# Patient Record
Sex: Female | Born: 1970 | Race: White | Hispanic: Yes | Marital: Married | Smoking: Never smoker
Health system: Southern US, Community
[De-identification: ages and names within clinical notes are randomized; demographics above are authoritative.]

## PROBLEM LIST (undated history)

## (undated) DIAGNOSIS — E559 Vitamin D deficiency, unspecified: Secondary | ICD-10-CM

## (undated) DIAGNOSIS — E785 Hyperlipidemia, unspecified: Secondary | ICD-10-CM

## (undated) DIAGNOSIS — O24419 Gestational diabetes mellitus in pregnancy, unspecified control: Secondary | ICD-10-CM

## (undated) DIAGNOSIS — E049 Nontoxic goiter, unspecified: Secondary | ICD-10-CM

## (undated) HISTORY — DX: Nontoxic goiter, unspecified: E04.9

## (undated) HISTORY — DX: Vitamin D deficiency, unspecified: E55.9

## (undated) HISTORY — DX: Hyperlipidemia, unspecified: E78.5

## (undated) HISTORY — DX: Gestational diabetes mellitus in pregnancy, unspecified control: O24.419

---

## 2001-09-30 ENCOUNTER — Other Ambulatory Visit: Admission: RE | Admit: 2001-09-30 | Discharge: 2001-09-30 | Payer: Self-pay | Admitting: Gynecology

## 2002-11-27 ENCOUNTER — Other Ambulatory Visit: Admission: RE | Admit: 2002-11-27 | Discharge: 2002-11-27 | Payer: Self-pay | Admitting: Gynecology

## 2003-12-15 ENCOUNTER — Other Ambulatory Visit: Admission: RE | Admit: 2003-12-15 | Discharge: 2003-12-15 | Payer: Self-pay | Admitting: Gynecology

## 2004-06-20 ENCOUNTER — Encounter: Admission: RE | Admit: 2004-06-20 | Discharge: 2004-06-20 | Payer: Self-pay | Admitting: Gynecology

## 2004-09-11 ENCOUNTER — Inpatient Hospital Stay (HOSPITAL_COMMUNITY): Admission: AD | Admit: 2004-09-11 | Discharge: 2004-09-14 | Payer: Self-pay | Admitting: Gynecology

## 2004-10-25 ENCOUNTER — Other Ambulatory Visit: Admission: RE | Admit: 2004-10-25 | Discharge: 2004-10-25 | Payer: Self-pay | Admitting: Gynecology

## 2005-11-05 ENCOUNTER — Other Ambulatory Visit: Admission: RE | Admit: 2005-11-05 | Discharge: 2005-11-05 | Payer: Self-pay | Admitting: Gynecology

## 2006-12-03 ENCOUNTER — Other Ambulatory Visit: Admission: RE | Admit: 2006-12-03 | Discharge: 2006-12-03 | Payer: Self-pay | Admitting: Gynecology

## 2007-12-15 ENCOUNTER — Other Ambulatory Visit: Admission: RE | Admit: 2007-12-15 | Discharge: 2007-12-15 | Payer: Self-pay | Admitting: Gynecology

## 2008-02-04 ENCOUNTER — Other Ambulatory Visit: Admission: RE | Admit: 2008-02-04 | Discharge: 2008-02-04 | Payer: Self-pay | Admitting: Diagnostic Radiology

## 2008-02-04 ENCOUNTER — Encounter: Admission: RE | Admit: 2008-02-04 | Discharge: 2008-02-04 | Payer: Self-pay | Admitting: Surgery

## 2008-02-04 ENCOUNTER — Encounter (INDEPENDENT_AMBULATORY_CARE_PROVIDER_SITE_OTHER): Payer: Self-pay | Admitting: Diagnostic Radiology

## 2008-12-20 ENCOUNTER — Other Ambulatory Visit: Admission: RE | Admit: 2008-12-20 | Discharge: 2008-12-20 | Payer: Self-pay | Admitting: Gynecology

## 2008-12-20 ENCOUNTER — Encounter: Payer: Self-pay | Admitting: Gynecology

## 2008-12-20 ENCOUNTER — Ambulatory Visit: Payer: Self-pay | Admitting: Gynecology

## 2008-12-21 ENCOUNTER — Ambulatory Visit: Payer: Self-pay | Admitting: Gynecology

## 2010-02-13 ENCOUNTER — Other Ambulatory Visit: Admission: RE | Admit: 2010-02-13 | Discharge: 2010-02-13 | Payer: Self-pay | Admitting: Gynecology

## 2010-02-13 ENCOUNTER — Ambulatory Visit: Payer: Self-pay | Admitting: Gynecology

## 2010-02-14 ENCOUNTER — Ambulatory Visit: Payer: Self-pay | Admitting: Gynecology

## 2010-02-20 ENCOUNTER — Ambulatory Visit: Payer: Self-pay | Admitting: Gynecology

## 2010-02-27 ENCOUNTER — Ambulatory Visit: Payer: Self-pay | Admitting: Gynecology

## 2010-06-26 ENCOUNTER — Ambulatory Visit: Payer: Self-pay | Admitting: Gynecology

## 2010-12-22 NOTE — H&P (Signed)
Tina Huffman, Tina Huffman               ACCOUNT NO.:  [id number]   MEDICAL RECORD NO.:  [id number]          PATIENT TYPE:  MAT   LOCATION:  [facility]                          FACILITY:  [facility]   PHYSICIAN:  Juan H. Lily Peer, M.D.DATE OF BIRTH:  1971/03/19   DATE OF ADMISSION:  09/11/2004  DATE OF DISCHARGE:                                HISTORY & PHYSICAL   The patient will be admitted to [facility] this evening.  Please have  history and physical available.   CHIEF COMPLAINT:  1.  Post dates.  2.  Gestational diabetic.   HISTORY:  The patient is a 40 year old gravidaida 1, para 0, whose estimated  date of confinement is February 2.  The patient currently 40-1/2 weeks'  estimated gestational age.  Has been followed with regular visits since she  was diagnosed with gestational diabetes and was controlled with diet.  She  has had nonstress tests and antepartum testing consisting of ultrasounds and  AFIs.  Her last ultrasound was done today in the office and demonstrated a  viable fetus at 7 pounds 15 ounces with an AFI of 10.7, vertex, heart rate  148 beats per minute, and grade 3 placenta.  Group B strep was negative.  The patient had declined genetic amniocentesis as well as cystic fibrosis  screen, but her AFP was normal, and GBS culture was negative.  The patient  is a Scientist, product/process development.  During her pregnancy she had been referred during  the third trimester to Dr. Sherrie George at [facility] due to an abnormal fetal  heart rate, but she did a detailed level III scan and no abnormalities were  noted.   PAST MEDICAL HISTORY:  The patient denies any allergies.  She had been  treated for a urinary tract infection during her pregnancy.  She has  otherwise had gestational diabetes but no other medical problems reported.   REVIEW OF SYSTEMS:  See [place] form.   PHYSICAL EXAMINATION:  VITAL SIGNS:  Her blood pressure today was 102/74,  and urine was negative for protein and sugar.  Weight  139 pounds.  HEENT:  Unremarkable.  NECK:  Supple, trachea midline.  No carotid bruits, no thyromegaly.  CHEST:  Lungs clear to auscultation without rhonchi or wheezes.  CARDIAC:  Regular rate and rhythm, no murmurs, gallops.  BREASTS:  Exam not done.  ABDOMEN:  Gravid uterus, vertex presentation by [employer].  PELVIC:  Cervix 1 cm and long, posterior.  EXTREMITIES:  DTR 1+, negative clonus.   PRENATAL LABORATORY DATA:  O positive blood type, negative antibody screen.  VDRL was nonreactive.  Rubella immune.  Hepatitis B surface antigen and HIV  were negative.  Alpha-fetoprotein was normal.  Diabetes screen abnormal with  abnormal three-hour GTT.  GBS culture was negative.   ASSESSMENT:  69.  A 40 year old gravida 1, para, at 40-1/2 weeks' estimated gestational      age.  2.  Gestational diabetic, diet-controlled.  3.  Ultrasound today with an estimated fetal weight of 7 pounds 15 ounces,      female, in the vertex presentation, normal amniotic fluid  index, group B      strep culture was negative, and reactive fetal heart rate tracing today.   The patient will be admitted this evening for Cervidil for cervical  ripening, followed by initiation of Pitocin the following morning.  Risks,  benefits and pros and cons were discussed with the patient, all questions  answered.  Will follow accordingly.   PLAN:  As per assessment above.      JHF/MEDQ  D:  09/11/2004  T:  09/11/2004  Job:  [id number]

## 2011-02-09 ENCOUNTER — Encounter: Payer: Self-pay | Admitting: *Deleted

## 2011-02-26 ENCOUNTER — Encounter: Payer: Self-pay | Admitting: Gynecology

## 2011-02-26 ENCOUNTER — Other Ambulatory Visit (HOSPITAL_COMMUNITY)
Admission: RE | Admit: 2011-02-26 | Discharge: 2011-02-26 | Disposition: A | Discharge status: Home / Self Care | Payer: Self-pay | Source: Ambulatory Visit | Attending: Gynecology | Admitting: Gynecology

## 2011-02-26 ENCOUNTER — Ambulatory Visit (INDEPENDENT_AMBULATORY_CARE_PROVIDER_SITE_OTHER): Payer: Self-pay | Admitting: Gynecology

## 2011-02-26 VITALS — BP 120/82 | Ht 59.75 in | Wt 129.0 lb

## 2011-02-26 DIAGNOSIS — Z833 Family history of diabetes mellitus: Secondary | ICD-10-CM

## 2011-02-26 DIAGNOSIS — Z13220 Encounter for screening for lipoid disorders: Secondary | ICD-10-CM

## 2011-02-26 DIAGNOSIS — Z01419 Encounter for gynecological examination (general) (routine) without abnormal findings: Secondary | ICD-10-CM | POA: Insufficient documentation

## 2011-02-26 DIAGNOSIS — E039 Hypothyroidism, unspecified: Secondary | ICD-10-CM

## 2011-02-26 DIAGNOSIS — Z124 Encounter for screening for malignant neoplasm of cervix: Secondary | ICD-10-CM

## 2011-02-26 DIAGNOSIS — Z0000 Encounter for general adult medical examination without abnormal findings: Secondary | ICD-10-CM

## 2011-02-26 DIAGNOSIS — R82998 Other abnormal findings in urine: Secondary | ICD-10-CM

## 2011-02-26 DIAGNOSIS — R635 Abnormal weight gain: Secondary | ICD-10-CM

## 2011-02-26 MED ORDER — LEVOTHYROXINE SODIUM 50 MCG PO TABS: 50.0000 ug | ORAL_TABLET | Freq: Every day | ORAL | Status: DC

## 2011-02-26 NOTE — Progress Notes (Signed)
Subjective:     Patient ID: Tina Huffman, female   DOB: June 28, 1971, 40 y.o.   MRN: [id number]  HPI8 year old female patient presented to the office for her annual gynecological examination. Patient with known left lower thyroid nodule. Patient been followed by Dr. Gerrit Friends. She is seeing him this year. Patient is currently on Synthroid 50 mcg daily. Patient is otherwise asymptomatic. She is having regular menstrual cycles and is using condoms for contraception.   Review of Systems  Constitutional: Negative.   HENT: Negative.   Eyes: Negative.   Respiratory: Negative.   Cardiovascular: Negative.   Gastrointestinal: Negative.   Genitourinary: Negative.   Musculoskeletal: Negative.   Skin: Negative.   Hematological: Negative.   All other systems reviewed and are negative.       Objective:   Physical Exam  Constitutional: She appears well-developed and well-nourished.  HENT:  Head: Normocephalic and atraumatic.  Eyes: Conjunctivae and EOM are normal. Pupils are equal, round, and reactive to light.  Neck: Normal range of motion. Neck supple.    Cardiovascular: Normal rate, regular rhythm and normal heart sounds.   Pulmonary/Chest: Effort normal and breath sounds normal.  Abdominal: Soft. Bowel sounds are normal.  Genitourinary: Vagina normal and uterus normal.  Musculoskeletal: Normal range of motion.  Neurological: She is alert.  Skin: Skin is warm.  Psychiatric: She has a normal mood and affect.       Assessment:    normal annual gynecological examination. Patient with persistent small nodule left lower lobe of thyroid gland. Patient had ultrasound scanning of the thyroid gland early this year by Dr. Gerrit Friends. We will check her thyroid function test today.     Plan:   patient to schedule her annual mammogram within the next few weeks. She will stop a lap to get a CBC thyroid function tests screening cholesterol random blood sugar urinalysis. Her Pap smear was done today.  We'll see her back in one year or when necessary.

## 2011-12-03 ENCOUNTER — Telehealth (INDEPENDENT_AMBULATORY_CARE_PROVIDER_SITE_OTHER): Payer: Self-pay | Admitting: Surgery

## 2011-12-10 ENCOUNTER — Other Ambulatory Visit (INDEPENDENT_AMBULATORY_CARE_PROVIDER_SITE_OTHER): Payer: Self-pay

## 2011-12-10 DIAGNOSIS — E041 Nontoxic single thyroid nodule: Secondary | ICD-10-CM

## 2011-12-13 ENCOUNTER — Ambulatory Visit
Admission: RE | Admit: 2011-12-13 | Discharge: 2011-12-13 | Disposition: A | Discharge status: Home / Self Care | Payer: No Typology Code available for payment source | Source: Ambulatory Visit | Attending: Surgery | Admitting: Surgery

## 2011-12-13 DIAGNOSIS — E041 Nontoxic single thyroid nodule: Secondary | ICD-10-CM

## 2012-01-03 ENCOUNTER — Ambulatory Visit (INDEPENDENT_AMBULATORY_CARE_PROVIDER_SITE_OTHER): Payer: No Typology Code available for payment source | Admitting: Surgery

## 2012-01-03 ENCOUNTER — Encounter (INDEPENDENT_AMBULATORY_CARE_PROVIDER_SITE_OTHER): Payer: Self-pay | Admitting: Surgery

## 2012-01-03 VITALS — BP 88/62 | HR 76 | Temp 97.7°F | Resp 14 | Ht 60.0 in | Wt 128.6 lb

## 2012-01-03 DIAGNOSIS — E039 Hypothyroidism, unspecified: Secondary | ICD-10-CM

## 2012-01-03 DIAGNOSIS — E041 Nontoxic single thyroid nodule: Secondary | ICD-10-CM

## 2012-01-03 MED ORDER — LEVOTHYROXINE SODIUM 50 MCG PO TABS: 50.0000 ug | ORAL_TABLET | Freq: Every day | ORAL | Status: DC

## 2012-01-03 NOTE — Progress Notes (Signed)
Visit Diagnoses: 1. Hypothyroidism   2. Thyroid nodule, uninodular, left lobe     HISTORY: The patient is a 41 year old female followed for a dominant left thyroid nodule. This lesion has waxed and waned in size. She is on thyroid hormone suppression with Synthroid 50 mcg daily.  Patient denies any compressive symptoms. She has not noted any change in self-examination. She denies any discomfort.  PERTINENT REVIEW OF SYSTEMS: Denies tremor. Denies palpitations. Denies dysphagia. Denies vocal changes.  EXAM: HEENT: normocephalic; pupils equal and reactive; sclerae clear; dentition good; mucous membranes moist NECK:  Palpable dominant nodule left thyroid lobe, relatively firm, mobile swallowing, approximately 3 cm in size; asymmetric on extension; no palpable anterior or posterior cervical lymphadenopathy; no supraclavicular masses; no tenderness CHEST: clear to auscultation bilaterally without rales, rhonchi, or wheezes CARDIAC: regular rate and rhythm without significant murmur; peripheral pulses are full EXT:  non-tender without edema; no deformity NEURO: no gross focal deficits; no sign of tremor   IMPRESSION: #1 Dominant left thyroid nodule, 3.4 cm, clinically stable, with history of benign cytopathology #2 hypothyroidism  PLAN: I discussed with the patient and her husband the options for management. At this point there is no absolute indication for resection. Patient remains asymptomatic. Fine-needle aspiration cytology is benign. Patient is tolerating her thyroid hormone suppression well.  I last her gynecologist to obtain a TSH level at the time of her other laboratory work scheduled for July of this year.  I have renewed her prescription for Synthroid 50 mcg daily for one year.  We will repeat her thyroid ultrasound examination in one year. Patient will return at that time for physical exam and to review the above results.  Velora Heckler, MD, FACS General & Endocrine  Surgery [facility]

## 2012-02-27 ENCOUNTER — Encounter: Payer: Self-pay | Admitting: Gynecology

## 2012-02-27 ENCOUNTER — Ambulatory Visit (INDEPENDENT_AMBULATORY_CARE_PROVIDER_SITE_OTHER): Payer: No Typology Code available for payment source | Admitting: Gynecology

## 2012-02-27 VITALS — BP 110/74 | Ht 60.0 in | Wt 130.0 lb

## 2012-02-27 DIAGNOSIS — E041 Nontoxic single thyroid nodule: Secondary | ICD-10-CM

## 2012-02-27 DIAGNOSIS — Z01419 Encounter for gynecological examination (general) (routine) without abnormal findings: Secondary | ICD-10-CM

## 2012-02-27 DIAGNOSIS — Z8632 Personal history of gestational diabetes: Secondary | ICD-10-CM | POA: Insufficient documentation

## 2012-02-27 DIAGNOSIS — E039 Hypothyroidism, unspecified: Secondary | ICD-10-CM

## 2012-02-27 LAB — LIPID PANEL
Cholesterol: 208 mg/dL — ABNORMAL HIGH (ref 0–200)
HDL: 40 mg/dL (ref >39)
LDL Cholesterol: 130 mg/dL — ABNORMAL HIGH (ref 0–99)
Total CHOL/HDL Ratio: 5.2 Ratio
Triglycerides: 188 mg/dL — ABNORMAL HIGH (ref <150)
VLDL: 38 mg/dL (ref 0–40)

## 2012-02-27 LAB — CBC WITH DIFFERENTIAL/PLATELET
Eosinophils Absolute: 0.1 10*3/uL (ref 0.0–0.7)
Eosinophils Relative: 3 % (ref 0–5)
Hemoglobin: 14.2 g/dL (ref 12.0–15.0)
Lymphocytes Relative: 27 % (ref 12–46)
Lymphs Abs: 1.6 10*3/uL (ref 0.7–4.0)
MCH: 30.3 pg (ref 26.0–34.0)
MCV: 89.1 fL (ref 78.0–100.0)
Monocytes Relative: 6 % (ref 3–12)
Neutrophils Relative %: 63 % (ref 43–77)
Platelets: 250 10*3/uL (ref 150–400)
RBC: 4.68 MIL/uL (ref 3.87–5.11)
WBC: 5.7 10*3/uL (ref 4.0–10.5)

## 2012-02-27 NOTE — Progress Notes (Signed)
Tina Huffman August 08, 1970 [id number]   History:    41 y.o.  for annual gyn exam with no complaints today. Patient with known history of a goiter (left thyroid nodule) who is being followed by Dr. Darnell Level. The thyroid nodule waxes and wanes in size but by in large has remained stable. The findings of her most recent ultrasound as follows:  Findings:  Right thyroid lobe: 11 x 11 x 57 mm, homogeneous in echotexture  Left thyroid lobe: 23 x 29 x 48 mm  Isthmus: 2.1 mm in thickness  Focal nodules: 21 x 25 x 34 mm complex mostly solid, inferior left  Lymphadenopathy: None visualized.  IMPRESSION:  1. 3.4 cm solitary left thyroid nodule. Correlate with  previous biopsy results.    Patient had been offered several options please see note from May 30 from Dr. Gerrit Friends. Patient is currently using condoms for contraception. Her cycles are reported to be regular. Her last mammogram was in 2010 which was normal. She does her monthly self breast examination. She has history of the past of hyperlipidemia and was on Zocor until last year currently on no medication and is working on diet and exercise. Her last Pap smear was normal in 2012. on Synthroid 50 mcg daily and is otherwise asymptomatic. Patient has had history of gestational diabetes in the past.   Past medical history,surgical history, family history and social history were all reviewed and documented in the EPIC chart.  Gynecologic History Patient's last menstrual period was 02/13/2012. Contraception: condoms Last Pap: 2012. Results were: normal Last mammogram: 2010. Results were: normal  Obstetric History OB History    Grav Para Term Preterm Abortions TAB SAB Ect Mult Living   1 1 1       1      # Outc Date GA Lbr Len/2nd Wgt Sex Del Anes PTL Lv   1 TRM     F SVD  No Yes       ROS: A ROS was performed and pertinent positives and negatives are included in the history.  GENERAL: No fevers or chills. HEENT: No change in vision, no  earache, sore throat or sinus congestion. NECK: No pain or stiffness. CARDIOVASCULAR: No chest pain or pressure. No palpitations. PULMONARY: No shortness of breath, cough or wheeze. GASTROINTESTINAL: No abdominal pain, nausea, vomiting or diarrhea, melena or bright red blood per rectum. GENITOURINARY: No urinary frequency, urgency, hesitancy or dysuria. MUSCULOSKELETAL: No joint or muscle pain, no back pain, no recent trauma. DERMATOLOGIC: No rash, no itching, no lesions. ENDOCRINE: No polyuria, polydipsia, no heat or cold intolerance. No recent change in weight. HEMATOLOGICAL: No anemia or easy bruising or bleeding. NEUROLOGIC: No headache, seizures, numbness, tingling or weakness. PSYCHIATRIC: No depression, no loss of interest in normal activity or change in sleep pattern.     Exam: chaperone present  BP 110/74  Ht 5' (1.524 m)  Wt 130 lb (58.968 kg)  BMI 25.39 kg/m2  LMP 02/13/2012  Body mass index is 25.39 kg/(m^2).  General appearance : Well developed well nourished female. No acute distress HEENT: Neck supple, trachea midline, no carotid bruits, left lower pole thyroid nodule to a half centimeters and mobile and firm  Lungs: Clear to auscultation, no rhonchi or wheezes, or rib retractions  Heart: Regular rate and rhythm, no murmurs or gallops Breast:Examined in sitting and supine position were symmetrical in appearance, no palpable masses or tenderness,  no skin retraction, no nipple inversion, no nipple discharge, no skin discoloration, no axillary  or supraclavicular lymphadenopathy Abdomen: no palpable masses or tenderness, no rebound or guarding Extremities: no edema or skin discoloration or tenderness  Pelvic:  Bartholin, Urethra, Skene Glands: Within normal limits             Vagina: No gross lesions or discharge  Cervix: No gross lesions or discharge  Uterus  anteverted, normal size, shape and consistency, non-tender and mobile  Adnexa  Without masses or tenderness  Anus and  perineum  normal   Rectovaginal  normal sphincter tone without palpated masses or tenderness             Hemoccult not done     Assessment/Plan:  41 y.o. female for annual exam with history of stable left thyroid nodule. Patient been suppressed with Synthroid 50 mcg daily. Patient is being followed by the general surgeon Dr. Gerrit Friends. Thyroid panel will be work today along with a fasting lipid profile and fasting blood sugar and CBC and urinalysis. She will not need a Pap smear today. New Pap smear screening guidelines discussed. Requisition was provided her to schedule her mammogram. She was encouraged to continue to do her monthly self breast examination.    Ok Edwards MD, 9:36 AM 02/27/2012    All

## 2012-02-27 NOTE — Patient Instructions (Addendum)
Enfermedades tiroideas  (Thyroid Diseases)  La tiroides es una glándula con forma de mariposa que se encuentra en el cuello. Se ubica justo por arriba de las clavículas. Es una de las glándulas endocrinas, que producen hormonas. La tiroides interviene en el control del metabolismo. El metabolismo es el modo en que el organismo utiliza los alimentos.   Millones de personas sufren enfermedades tiroideas. Las mujeres experimentan problemas en las tiroides con más frecuencia que los hombres. De hecho, los problemas de una tiroides muy activa (hipertiroidismo) ocurre en el 1% de toda las mujeres. Si usted sufre una enfermedad en la tiroides, su organismo puede estar utilizando la energía más lentamente, o más rápidamente de lo que debería.   Los problemas en las tiroides también incluyen una enfermedad inmunológica en la que el organismo reacciona en contra de la glándula tiroides (enfermedad denominada tiroiditis). Un problema diferente implica la aparición de bultos (denominados nódulos) que se desarrollan en la glándula. Los nódulos, generalmente pero no siempre, son benignos.  PROBLEMAS DE TIROIDES MÁS FRECUENTES Y SUS CAUSAS  Hay muchas causas que originan problemas en las tiroides. El tratamiento depende del diagnóstico exacto e incluye la recomposición del metabolismo a valores normales.  Hipertiroidismo  Se denomina hipertiroidismo al exceso de hormona tiroidea debido a una glándula tiroides muy activa. En el hipertiroidismo, el metabolismo se acelera. Una de las formas más frecuentes de hipertiroidismo se denomina enfermedad de [place]. Esta enfermedad tiende a aparecer en algunas familias. Aunque se considera que la causa se encuentra en un problema del sistema inmunológico, la naturaleza exacta del problema genético es desconocida.  Hipotiroidismo  Se denomina hipotiroidismo a la falta de hormona tiroidea debido a una glándula tiroidea poco activa. En el hipotiroidismo, el metabolismo se ralentiza. Son varias  las causas para esta enfermedad. La mayoría de las causas afectan la glándula tiroides directamente y dañan su capacidad de fabricar la cantidad suficiente de hormonas.   En algunos casos raros de tratarse de un tumor en la glándula hipófisis (ubicada cerca de la base del cerebro). El tumor puede bloquear la hipófisis y hacer que deje de producir la hormona que estimula la tiroides (TSH), El organismo produce TSH para estimular buen funcionamiento de la tiroides. Si la hipófisis no produce la cantidad suficiente de TSH, la tiroides no puede producir la cantidad suficiente de hormonas necesarias para mantener un buen estado de salud.  Ya sea que el problema se encuentre en una enfermedad de la tiroides o de la hipófisis, el resultado es que la tiroides no produce la cantidad suficiente de hormonas. El hipotiroidismo ocasiona el mal funcionamiento de muchos procesos físicos y mentales. El organismo consume menos oxígeno y produce menos calor corporal.  Nódulos en las tiroides  Un módulo en la tiroides es un pequeño bulto que se encuentra sobre la glándula. Estos nódulos son frecuentes. Puede ser un crecimiento del tejido tiroideo o un quiste lleno de líquido. Ambos forman un bulto sobre la glándula. Casi la mitad de todas las personas tendrá pequeños nódulos en las tiroides en algún momento de su vida. Típicamente no pueden notarse hasta que son grandes y afectan el tamaño normal de la tiroides. Los nódulos más grandes (alrededor de 1 cm) ocurren en alrededor del 5% de las personas.  Aunque la mayoría de estos nódulos no son malignos, deberán realizarse los estudios para descartar el cáncer. También algunos nódulos pueden producir mucha cantidad de hormona tiroidea o agrandarse demasiado. Los nódulos grandes o una glándula agrandada pueden interferir en la   Otros problemas son el cncer y la tiroiditis La tiroiditis es un mal  funcionamiento del sistema inmunolgico del organismo. Normalmente, el sistema inmunolgico funciona defendiendo al organismo de las infecciones y [employer]. Cuando el sistema inmunolgico no funciona adecuadamente, puede atacar clulas, tejidos y rganos normales. Ejemplos de enfermedades autoinmunes son la tiroiditis de Hashimoto (que causa una funcin tiroidea lenta) y la enfermedad de [place] (que causa exceso del funcionamiento tiroideo). SNTOMAS Los sntomas varan segn el tipo exacto del problema de la tiroides. Hipertiroidismo- cuando la glndula tiroides es [place] y produce ms hormona tiroidea que la que el organismo necesita. La causa ms frecuente es la enfermedad de [place]. Demasiada cantidad de hormona tiroidea puede causar alguno o todos los siguientes sntomas.  Ansiedad.   Irritabilidad.   Dificultad para dormir.   Fatiga.   Latidos cardacos irregulares o rpidos.   Temblor fino en manos o dedos.   Aumento de la transpiracin.   Sensibilidad al calor.   Prdida de peso a pesar de la ingesta normal de alimentos.   Debilitamiento del cabello.   Agrandamiento de las tiroides (bocio).   Perodos menstruales escasos.   Movimientos intestinales frecuentes.  La enfermedad de [place] puede especificamente causar problemas en la piel y los ojos. Los problemas en la piel implican el enrojecimiento e hinchazn de la pierna y de la parte superior de los pies. Los problemas oculares pueden consistir en:  Manson [place] de lagrimeo y sensacin de tener un cuerpo extrao en uno o ambos ojos.   Enrojecimiento o inflamacin.   Mayor [employer] prpados.   Hinchazn de los prpados y los tejidos que rodean los ojos.   Sensibilidad a la luz.   lceras en la crnea.   Visin doble.   Movimientos limitados de los ojos.   Visin borrosa o reducida.  Hipotiroidismo: la glndula tiroides no es lo Science writer. Es un trastorno ms frecuente que el  hipertiroidismo. Los sntomas pueden variar mucho segn la gravedad de la deficiencia hormonal. Los sntomas pueden desarrollarse durante un largo perodo incluyen algunos de los siguientes:  [place].   Lentitud   Mayor sensibilidad al fro.   Constipacin.   Piel plida y [place].   Rostro hinchado.   Voz ronca.   Nivel elevado de colesterol en la sangre.   Aumento de peso sin motivo.   Dolor , sensibilidad y Forensic psychologist.   Dolor, rigidez o Architect.   Debilitamiento muscular.   Perodos menstruales ms abundantes que lo normal.   Cabello y uas debilitados.   Depresin.  Ndulos en las tiroides- generalmente no causan signos o sntomas, En ocasiones pueden agrandarse de modo que puede sentirlos o verlos como una zona hinchada en la base del cuello. Puede notar el bulto al [employer]. Los hombres pueden notarlo cuando el cuello de la camisa comienza a sentirse ajustado. Algunos ndulos producen demasiada hormona tiroidea. Esto puede [employer] mismos sntomas que el hipertiroidismo (vase ms [place]).  Los ndulos en las tiroides rara vez son malignos. Sin embargo, es probable que sea maligno si:  Crece rpidamente o se lo siente duro.   Le ocasiona ronquera o tiene problemas para tragar o respirar.   Se agrandan los ganglios debajo de la mandbula o en el cuello.  DIAGNSTICO [employer] tantas enfermedades de las tiroides, Set designer algunas pruebas. Deber establecer un diagnstico exacto. Estas pruebas pueden ser:  Anlisis de sangre y anticuerpos.   Escaneos especiales de la  tiroides, usando pequeas cantidades inocuas de yodo radiactivo.   Ecografas de la glndula, (especialmente si hay un ndulo o bulto).   Una biopsia. Esto se lleva a cabo con una aguja especial. Una biopsia de aguja es un procedimiento por el que se obtiene [place] de clulas de la glndula. El tejido ser [employer] en un laboratorio y  examinado al microscopio.  TRATAMIENTO El tratamiento depende del diagnstico exacto. Hipertiroidismo  Los betabloqueantes ayudan a [employer].   Los medicamentos antitiroideos evitan la produccin excesiva de hormona.   El tratamiento con yodo radiactivo puede destruir las clulas tiroideas hiperactivas. Reduce permanentemente la cantidad de hormona que se produce.   Tratamiento quirrgico para extirpar la glndula.   Tratamientos para los problemas oculares como en la enfermedad de [place] tambin incluyen medicamentos y cirugas oculares especiales, si se considera apropiado.  Hipotiroidismo Reposicin de hormona tiroidea con levotiroxina es el tratamiento fundamental. La reposicin de hormona tiroidea es un tratamiento de por vida y requerir el control y ajustes peridicos. Ndulos en las tiroides  Hay que observarlos. Si un ndulo pequeo no causa sntomas o no hay signos de cncer en la biopsia, no se realiza tratamiento. Se recomienda realizar exmenes y anlisis de [place] peridicamente.   Los medicamentos antitiroideos o el que se realiza con yodo radiactivo se recomiendan si los ndulos producen demasiada hormona (vase Tratamiento para el hipotiroidismo, ms [place]).   Ablacin alcohlica Las inyecciones de pequeas cantidades de alcohol etlico (etanol) pueden hacer que un ndulo no canceroso disminuya su tamao.   Ciruga (vase Tratamiento para el hipertiroidismo, ms [place])  INSTRUCCIONES PARA EL CUIDADO DOMICILIARIO  Tome todos los medicamentos de la forma en que se le indic.   Hgase los anlisis recomendados.  SOLICITE ATENCIN MDICA SI:  Siente que tiene sntomas de hipertiroidismo o hipotiroidismo como los que se han descrito.   Desarrolla nuevos bultos o ndulos en el cuello o en la zona de la tiroides que no haba notado antes.   Siente efectos adversos por los medicamentos prescriptos.   Tiene problemas para hablar, respirar o tragar.   SOLICITE ATENCIN MDICA DE INMEDIATO SI:  La temperatura se eleva por encima de 102 F (39.8 C).   La sudoracin es intensa.   Tiene palpitaciones o frecuencia cardaca acelerada.   Le falta el aire.   Presenta nuseas o vmitos.   Tiene temblores intensos.   Se siente agitado.   Se marea o tiene un episodio de [place].  Document Released: 11/08/2008 Document Revised: 07/12/2011 [facility] Patient Information 2012 [place], [place].

## 2012-02-28 LAB — URINALYSIS W MICROSCOPIC + REFLEX CULTURE
Casts: NONE SEEN
Crystals: NONE SEEN
Glucose, UA: NEGATIVE mg/dL
Hgb urine dipstick: NEGATIVE
Nitrite: NEGATIVE
Specific Gravity, Urine: 1.009 (ref 1.005–1.030)
pH: 7.5 (ref 5.0–8.0)

## 2012-03-02 LAB — URINE CULTURE: Colony Count: >=100000

## 2012-03-04 ENCOUNTER — Ambulatory Visit (INDEPENDENT_AMBULATORY_CARE_PROVIDER_SITE_OTHER): Payer: No Typology Code available for payment source | Admitting: Gynecology

## 2012-03-04 ENCOUNTER — Encounter: Payer: Self-pay | Admitting: Gynecology

## 2012-03-04 ENCOUNTER — Other Ambulatory Visit: Payer: Self-pay | Admitting: *Deleted

## 2012-03-04 VITALS — BP 110/78

## 2012-03-04 DIAGNOSIS — E785 Hyperlipidemia, unspecified: Secondary | ICD-10-CM | POA: Insufficient documentation

## 2012-03-04 DIAGNOSIS — E079 Disorder of thyroid, unspecified: Secondary | ICD-10-CM

## 2012-03-04 DIAGNOSIS — E7800 Pure hypercholesterolemia, unspecified: Secondary | ICD-10-CM

## 2012-03-04 NOTE — Patient Instructions (Signed)
Control del colesterol  Los niveles de colesterol en el organismo estn determinados significativamente por su dieta. Los niveles de colesterol tambin se relacionan con la enfermedad cardaca. El material que sigue ayuda a explicar esta relacin y a analizar qu puede hacer para mantener su corazn sano. No todo el colesterol es malo. Las lipoprotenas de baja densidad (LDL) forman el colesterol "malo". El colesterol malo puede ocasionar depsitos de grasa que se acumulan en el interior de las arterias. Las lipoprotenas de alta densidad (HDL) es el colesterol "bueno". Ayuda a remover el colesterol LDL "malo" de la sangre. El colesterol es un factor de riesgo muy importante para la enfermedad cardaca. Otros factores de riesgo son la hipertensin arterial, el hbito de fumar, el estrs, la herencia y el peso.   El msculo cardaco obtiene el suministro de sangre a travs de las arterias coronarias. Si su colesterol LDL ("malo") est elevado y el HDL ("bueno") es bajo, tiene un factor de riesgo para que se formen depsitos de grasa en las arterias coronarias (los vasos sanguneos que suministran sangre al corazn). Esto hace que haya menos lugar para que la sangre circule. Sin la suficiente sangre y oxgeno, el msculo cardaco no puede funcionar correctamente, y usted podr sentir dolores en el pecho (angina pectoris). Cuando una arteria coronaria se cierra completamente, una parte del msculo cardaco puede morir (infarto de miocardio).  CONTROL DEL COLESTEROL Cuando el profesional que lo asiste enva la sangre al laboratorio para conocer el nivel de colesterol, puede realizarle tambin un perfil completo de los lpidos. Con esta prueba, se puede determinar la cantidad total de colesterol, as como los niveles de LDL y HDL. Los triglicridos son un tipo de grasa que circula en la sangre y que tambin puede utilizarse  para determinar el riesgo de enfermedad cardaca. En la siguiente tabla se establecen los nmeros ideales: Prueba: Colesterol total  Menos de 200 mg/dl.  Prueba: LDL "colesterol malo"  Menos de 100 mg/dl.   Menos de 70 mg/dl si tiene riesgo muy elevado de sufrir un ataque cardaco o muerte cardaca sbita.  Prueba: HDL "colesterol bueno"  Mujeres: Ms de 50 mg/dl.   Hombres: Ms de 40 mg/dl.  Prueba: Trigliceridos  Menos de 150 mg/dl.    CONTROL DEL COLESTEROL CON DIETA Aunque factores como el ejercicio y el estilo de vida son importantes, la "primera lnea de ataque" es la dieta. Esto se debe a que se sabe que ciertos alimentos hacen subir el colesterol y otros lo bajan. El objetivo debe ser equilibrar los alimentos, de modo que tengan un efecto sobre el colesterol y, an ms importante, reemplazar las grasas saturadas y trans con otros tipos de grasas, como las monoinsaturadas y las poliinsaturadas y cidos grasos omega-3 . En promedio, una persona no debe consumir ms de 15 a 17 g de grasas saturadas por da. Las grasas saturadas y trans se consideran grasas "malas", ya que elevan el colesterol LDL. Las grasas saturadas se encuentran principalmente en productos animales como carne, manteca y crema. Pero esto no significa que usted debe sacrificar todas sus comidas favoritas. Actualmente, como lo muestra el cuadro que figura al final de este documento, hay sustitutos de buen sabor, bajos en grasas y en colesterol, para la mayora de los alimentos que a usted le gusta comer. Elija aquellos alimentos alternativos que sean bajos en grasas o sin grasas. Elija cortes de carne del cuarto trasero o lomo ya que estos cortes son los que tienen menor cantidad de grasa   y colesterol. El pollo (sin piel), el pescado, la carne de ternera, y la pechuga de pavo molida son excelentes opciones. Elimine las carnes grasosas como los hotdogs o el salami. Los mariscos tienen poco o nada de grasas saturadas. Cuando  consuma carne magra, carne de aves de corral, o pescado, hgalo en porciones de 85 gramos (3 onzas). Las grasas trans tambin se llaman "aceites parcialmente hidrogenados". Son aceites manipulados cientficamente de modo que son slidos a temperatura ambiente, tienen una larga vida y mejoran el sabor y la textura de los alimentos a los que se agregan. Las grasas trans se encuentran en la margarina, masitas, crackers y alimentos horneados.  Para hornear y cocinar, el aceite es un excelente sustituto para la mantequilla. Los aceites monoinsaturados tienen un beneficio particular, ya que se cree que disminuyen el colesterol LDL (colesterol malo) y elevan el HDL. Deber evitar los aceites tropicales saturados como el de coco y el de palma.  Recuerde, adems, que puede comer sin restricciones los grupos de alimentos que son naturalmente libres de grasas saturadas y grasas trans, entre los que se incluyen el pescado, las frutas (excepto el aguacate), verduras, frijoles, cereales (cebada, arroz, cuzcuz, trigo) y las pastas (sin salsas con crema)   IDENTIFIQUE LOS ALIMENTOS QUE DISMINUYEN EL COLESTEROL  Pueden disminuir el colesterol las fibras solubles que estn en las frutas, como las manzanas, en los vegetales como el brcoli, las patatas y las zanahorias; en las legumbres como frijoles, guisantes y lentejas; y en los cereales como la cebada. Los alimentos fortificados con fitosteroles tambin pueden disminuir el colesterol. Debe consumir al menos 2 g de estos alimentos a diario para obtener el efecto de disminucin de colesterol.  En el supermercado, lea las etiquetas de los envases para identificar los alimentos bajos en grasas saturadas, libres de grasas trans y bajos en grasas, . Elija quesos que tengan solo de 2 a 3 g de grasa saturada por onza (28,35 g). Use una margarina que no dae el corazn, libre de grasas trans o aceite parcialmente hidrogenado. Al comprar alimentos horneados (galletitas dulces y  galletas) evite el aceite parcialmente hidrogenado. Los panes y bollos debern ser de granos enteros (harina de maz o de avena entera, en lugar de "harina" o "harina enriquecida"). Compre sopas en lata que no sean cremosas, con bajo contenido de sal y sin grasas adicionadas.   TCNICAS DE PREPARACIN DE LOS ALIMENTOS  Nunca fra los alimentos en aceite abundante. Si debe frer, hgalo en poco aceite y removiendo constantemente, porque as se utilizan muy pocas grasas, o utilice un spray antiadherente. Cuando le sea posible, hierva, hornee o ase las carnes y cocine los vegetales al vapor. En vez de aderezar los vegetales con mantequilla o margarina, utilice limn y hierbas, pur de manzanas y canela (para las calabazas y batatas), yogurt y salsa descremados y aderezos para ensaladas bajos en contenido graso.   BAJO EN GRASAS SATURADAS / SUSTITUTOS BAJOS EN GRASA  Carnes / Grasas saturadas (g)  Evite: Bife, corte graso (3 oz/85 g) / 11 g   Elija: Bife, corte magro (3 oz/85 g) / 4 g   Evite: Hamburguesa (3 oz/85 g) / 7 g   Elija:  [place]amburguesa magra (3 oz/85 g) / 5 g   Evite: Jamn (3 oz/85 g) / 6 g   Elija:  Jamn magro (3 oz/85 g) / 2.4 g   Evite: Pollo, con piel (3 oz/85 g), Carne oscura / 4 g   Elija:  Pollo, sin piel (  3 oz/85 g), Carne oscura / 2 g   Evite: Pollo, con piel (3 oz/85 g), Carne magra / 2.5 g   Elija: Pollo, sin piel (3 oz/85 g), Carne magra / 1 g  Lcteos / Grasas saturadas (g)  Evite: Leche entera (1 taza) / 5 g   Elija: Leche con bajo contenido de grasa, 2% (1 taza) / 3 g   Elija: Leche con bajo contenido de grasa, 1% (1 taza) / 1.5 g   Elija: Leche descremada (1 taza) / 0.3 g   Evite: Queso duro (1 oz/28 g) / 6 g   Elija: Queso descremado (1 oz/28 g) / 2-3 g   Evite: Queso cottage, 4% grasa (1 taza)/ 6.5 g   Elija: Queso cottage con bajo contenido de grasa, 1% grasa (1 taza)/ 1.5 g   Evite: Helado (1 taza) / 9 g   Elija: Sorbete (1 taza) / 2.5 g    Elija: Yogurt helado sin contenido de grasa (1 taza) / 0.3 g   Elija: Barras de fruta congeladas / vestigios   Evite: Crema batida (1 cucharada) / 3.5 g   Elija: Batidos glac sin lcteos (1 cucharada) / 1 g  Condimentos / Grasas saturadas (g)  Evite: Mayonesa (1 cucharada) / 2 g   Elija: Mayonesa con bajo contenido de grasa (1 cucharada) / 1 g   Evite: Manteca (1 cucharada) / 7 g   Elija: Margarina extra light (1 cucharada) / 1 g   Evite: Aceite de coco (1 cucharada) / 11.8 g   Elija: Aceite de oliva (1 cucharada) / 1.8 g   Elija: Aceite de maz (1 cucharada) / 1.7 g   Elija: Aceite de crtamo (1 cucharada) / 1.2 g   Elija: Aceite de girasol (1 cucharada) / 1.4 g   Elija: Aceite de soja (1 cucharada) / 2.4 g   Elija: Aceite de canola (1 cucharada) / 1 g  Document Released: 07/23/2005 Document Revised: 04/04/2011 [employer] Patient Information 2012 [employer].  

## 2012-03-04 NOTE — Progress Notes (Signed)
Patient was seen in the office in July 24 for her annual gynecological examination. Review of her record indicated that in the past she had been on Crestor for hyperlipidemia and then her internist then discontinued it because she brought her levels down to normal as a result of proper nutrition and regular exercise. She is here for consultation to discuss her recent lipid profile as well as her thyroid function test. Results as follows:  Results for Tina Huffman, Tina Huffman (MRN [id number]) as of 03/04/2012 12:47  Ref. Range 02/27/2012 09:31  Glucose Latest Range: 70-99 mg/dL 90  Cholesterol Latest Range: 0-200 mg/dL 956 (H)  Triglycerides Latest Range: <150 mg/dL 213 (H)  HDL Latest Range: >39 mg/dL 40  LDL (calc) Latest Range: 0-99 mg/dL 086 (H)  VLDL Latest Range: 0-40 mg/dL 38  Total CHOL/HDL Ratio No range found 5.2  WBC Latest Range: 4.0-10.5 K/uL 5.7  RBC Latest Range: 3.87-5.11 MIL/uL 4.68  Hemoglobin Latest Range: 12.0-15.0 g/dL 57.8  HCT Latest Range: 36.0-46.0 % 41.7  MCV Latest Range: 78.0-100.0 fL 89.1  MCH Latest Range: 26.0-34.0 pg 30.3  MCHC Latest Range: 30.0-36.0 g/dL 46.9  RDW Latest Range: 11.5-15.5 % 13.4  Platelets Latest Range: 150-400 K/uL 250  Neutrophils Relative Latest Range: 43-77 % 63  Lymphocytes Relative Latest Range: 12-46 % 27  Monocytes Relative Latest Range: 3-12 % 6  Eosinophils Relative Latest Range: 0-5 % 3  Basophils Relative Latest Range: 0-1 % 1  NEUT# Latest Range: 1.7-7.7 K/uL 3.6  Lymphocytes Absolute Latest Range: 0.7-4.0 K/uL 1.6  Monocytes Absolute Latest Range: 0.1-1.0 K/uL 0.3  Eosinophils Absolute Latest Range: 0.0-0.7 K/uL 0.1  Basophils Absolute Latest Range: 0.0-0.1 K/uL 0.0  Smear Review No range found Criteria for review not met  TSH Latest Range: 0.350-4.500 uIU/mL 1.262  T4, Total Latest Range: 5.0-12.5 ug/dL 9.0  Free Thyroxine Index Latest Range: 1.0-3.9  3.6   In 2011 her total triglycerides before treatment had been 259. She wishes  not to go on any statins at the present time and stated that she would like to restart again on a regular diet and exercise. She will be referred to the [facility] for guidance and then we will repeat her fasting lipid profile in 6 months. Her thyroid function tests were normal with exception her T4 uptake was slightly elevated at 39.5 normal less than 37%. She will stay on her current Synthroid dose of 50 mcg daily. We'll forward these results to Dr. Darnell Level who has been monitoring her thyroid nodule/hypothyroidism. We'll repeat her thyroid function test when she comes back for her fasting lipid profile.

## 2012-03-11 ENCOUNTER — Encounter: Payer: Self-pay | Admitting: Gynecology

## 2012-03-14 ENCOUNTER — Other Ambulatory Visit: Payer: Self-pay | Admitting: Anesthesiology

## 2012-03-14 MED ORDER — NITROFURANTOIN MONOHYD MACRO 100 MG PO CAPS: 100.0000 mg | ORAL_CAPSULE | Freq: Two times a day (BID) | ORAL | Status: AC

## 2012-03-14 NOTE — Progress Notes (Signed)
PATIENT HAS BEEN INFORMED OF UTI PRESENT AND RX HAS BEEN SENT TO PHARMACY...

## 2012-03-17 ENCOUNTER — Other Ambulatory Visit: Payer: No Typology Code available for payment source

## 2012-03-18 ENCOUNTER — Telehealth: Payer: Self-pay | Admitting: *Deleted

## 2012-03-18 NOTE — Telephone Encounter (Signed)
Message copied by Libby Maw on Tue Mar 18, 2012  9:38 AM ------      Message from: Ok Edwards      Created: Tue Mar 04, 2012 12:20 PM       Please schedule a consult appointment for this patient with the [facility] for dietary assistance as a result of patient's hyperlipidemia. Afternoons are better for her. Thank you. JF

## 2012-03-18 NOTE — Telephone Encounter (Signed)
appt set with Tina Huffman on 04/03/12 @ 3pm.

## 2012-04-03 ENCOUNTER — Encounter: Payer: No Typology Code available for payment source | Attending: Gynecology | Admitting: Dietician

## 2012-04-03 ENCOUNTER — Encounter: Payer: Self-pay | Admitting: Dietician

## 2012-04-03 VITALS — Ht 60.0 in | Wt 129.0 lb

## 2012-04-03 DIAGNOSIS — E7800 Pure hypercholesterolemia, unspecified: Secondary | ICD-10-CM | POA: Insufficient documentation

## 2012-04-03 DIAGNOSIS — Z713 Dietary counseling and surveillance: Secondary | ICD-10-CM | POA: Insufficient documentation

## 2012-04-03 DIAGNOSIS — E785 Hyperlipidemia, unspecified: Secondary | ICD-10-CM

## 2012-04-03 NOTE — Progress Notes (Signed)
  Medical Nutrition Therapy:  Appt start time: 1500 end time:  1600.   Assessment:  Primary concerns today: Coming to learn more about foods that help lower cholesterol or help keep the cholesterol lower.   Frustrated with the need to possibly take medications. She is unsure of her family history.  Willing to exercise and make nutritional changes.   MEDICATIONS: Completed Med review.   DIETARY INTAKE:  Usual eating pattern includes 32-3 meals and 1-3 snacks per day.  Everyday foods include fruits, vegetables, salmon.  Avoided foods include fried, fatty foods.    24-hr recall:  B ( AM): 7:45  Smoothie (16 oz, vegetables, celery,  Carrots, spinach strawberries, blueberries, a little water or a little OJ to mix it.) Cereal (honey bunch of oats, 3/4 cups with 1/2 cup almond milk)   Snk ( AM): 9:00 1/2 banana or 1/2 apple (some fruit)  L ( PM): 12:30 sandwich (2 bread, avacado, ) sometimes [place], water, grapes sometimes. Snk ( PM): 2:30:  banana or watermelon, fruit, apple sometimes nuts (almonds, granola of nuts and honey, nuts)  D ( PM): 5:30 chicken soup (chicken, broth, carrot, zucchni celery parsley, rice)  2 cups and bread 1 slice white bread (artisian bread) Snk ( PM): Granola, fruit Beverages: water,   Usual physical activity: running 30 minutes 3 days per week. Has had a personal trainer that has developed a routine for her which will do 3-4 days per week at home.  This includes; running, weights, jumping rope.       Estimated energy needs: Ht:60 in  WT: 129.0 lb BMI: 25.2 kg/m2  Adj Wt: 109 lb (50 kg) 1200-1300 calories 140-145 g carbohydrates 95-100 g protein 34-36 g fat  Progress Towards Goal(s):  In progress.   Nutritional Diagnosis:  NI-5.6.3 Inappropriate intake of fats (specify): a history of increased total cholesterol, LDL cholesterol, and triglycerides. with the use of Chrestor for heloping to lower levels. As related to Current Total Cholesterol of 208, LDL of 130 and  Triglycerides of 188 mg..  As evidenced by total cholesterol of 208, LDl of 130 mg and TG of 188 with a decrease in the HDL to 40 mg/dl..    Intervention:  Nutrition Review of her current eating patterns and the need to monitor portions.  She is using the almond milk and the increase in her fruit Angle Karel explain the increase in triglycerides.  Review of the use of more fish and poultry and limiting the red meats.  Encouraged to continue her limited fat intake to the weekend times and continue to avoid the red meats and fattier foods when dining out.  Review of the use of a diet high in vegetables and beans, peas and lentils for protein and more complex starches.  Review of the oils to use and the avoidance of the saturated fats, most of the polyunsaturated fats and the partially hydrogenated fats.  Handouts given during visit include:  ADA Guide to Heart Health Nutrition  Approaches to controlling HDL, LDL, and Triglyceride.  List of the non-starchy vegetables  Food Label with fat and cholesterol, sugar and fiber recommendations.  Monitoring/Evaluation:  Dietary intake, exercise, and body weight with questions as needed and call for an appointment for further support/education.

## 2012-04-17 ENCOUNTER — Encounter: Payer: Self-pay | Admitting: Gynecology

## 2012-06-02 ENCOUNTER — Encounter: Payer: Self-pay | Admitting: Gynecology

## 2012-10-09 NOTE — Progress Notes (Signed)
Recall mailed to pt

## 2012-12-17 ENCOUNTER — Other Ambulatory Visit (INDEPENDENT_AMBULATORY_CARE_PROVIDER_SITE_OTHER): Payer: Self-pay

## 2012-12-17 ENCOUNTER — Telehealth (INDEPENDENT_AMBULATORY_CARE_PROVIDER_SITE_OTHER): Payer: Self-pay

## 2012-12-17 DIAGNOSIS — E042 Nontoxic multinodular goiter: Secondary | ICD-10-CM

## 2012-12-17 NOTE — Telephone Encounter (Signed)
LMOM for pt to call. Pt needs to have thyroid u/s prior to 5-19 ov. Order is in epic. Pt can call [phone] to set this up.

## 2012-12-22 ENCOUNTER — Encounter (INDEPENDENT_AMBULATORY_CARE_PROVIDER_SITE_OTHER): Payer: Self-pay | Admitting: Surgery

## 2012-12-22 ENCOUNTER — Ambulatory Visit (INDEPENDENT_AMBULATORY_CARE_PROVIDER_SITE_OTHER): Payer: BC Managed Care – PPO | Admitting: Surgery

## 2012-12-22 VITALS — BP 100/64 | HR 72 | Temp 97.4°F | Resp 16 | Ht 61.0 in | Wt 128.0 lb

## 2012-12-22 DIAGNOSIS — E041 Nontoxic single thyroid nodule: Secondary | ICD-10-CM

## 2012-12-22 DIAGNOSIS — E039 Hypothyroidism, unspecified: Secondary | ICD-10-CM

## 2012-12-22 MED ORDER — LEVOTHYROXINE SODIUM 50 MCG PO TABS: 50.0000 ug | ORAL_TABLET | Freq: Every day | ORAL | Status: DC

## 2012-12-22 NOTE — Progress Notes (Signed)
General Surgery [facility]  Visit Diagnoses: 1. Thyroid nodule, uninodular, left lobe   2. Hypothyroidism     HISTORY: Patient is a 42 year old Hispanic female followed for a dominant left thyroid nodule. This has been clinically stable. She remains on thyroid hormone suppression with Synthroid 50 mcg daily. Her most recent TSH level was normal at 1.2. Patient has not had a followup ultrasound this year.  PERTINENT REVIEW OF SYSTEMS: Denies tremor. Denies palpitations. Denies pain. Energy level is good. Hair and nails are healthy.  EXAM: HEENT: normocephalic; pupils equal and reactive; sclerae clear; dentition good; mucous membranes moist NECK:  Palpation shows a dominant nodule in the inferior pole the left thyroid lobe measuring approximately 2.5 cm in size; nodule is smooth, mobile with swallowing, and nontender; asymmetric on extension; no palpable anterior or posterior cervical lymphadenopathy; no supraclavicular masses; no tenderness CHEST: clear to auscultation bilaterally without rales, rhonchi, or wheezes CARDIAC: regular rate and rhythm without significant murmur; peripheral pulses are full EXT:  non-tender without edema; no deformity NEURO: no gross focal deficits; no sign of tremor   IMPRESSION: Left thyroid nodule, medically stable, on thyroid hormone suppression  PLAN: Patient will continue on levothyroxine 50 mcg daily. I've reviewed her prescription for one year. We will obtain a thyroid ultrasound in the near future for review. I will contact her with those results. If the ultrasound results are stable compared to the prior year, we will wait 2 years before we repeat her studies.  Velora Heckler, MD, [facility] Office: [phone]

## 2012-12-22 NOTE — Patient Instructions (Signed)

## 2012-12-25 ENCOUNTER — Ambulatory Visit
Admission: RE | Admit: 2012-12-25 | Discharge: 2012-12-25 | Disposition: A | Discharge status: Home / Self Care | Payer: BC Managed Care – PPO | Source: Ambulatory Visit | Attending: Surgery | Admitting: Surgery

## 2012-12-25 ENCOUNTER — Telehealth (INDEPENDENT_AMBULATORY_CARE_PROVIDER_SITE_OTHER): Payer: Self-pay

## 2012-12-25 DIAGNOSIS — E041 Nontoxic single thyroid nodule: Secondary | ICD-10-CM

## 2012-12-25 DIAGNOSIS — E039 Hypothyroidism, unspecified: Secondary | ICD-10-CM

## 2012-12-25 NOTE — Telephone Encounter (Signed)
Pt advised of u/s report and f/u 2 years per Dr Ardine Eng request.

## 2013-02-27 ENCOUNTER — Encounter: Payer: Self-pay | Admitting: Gynecology

## 2013-02-27 ENCOUNTER — Ambulatory Visit (INDEPENDENT_AMBULATORY_CARE_PROVIDER_SITE_OTHER): Payer: BC Managed Care – PPO | Admitting: Gynecology

## 2013-02-27 VITALS — BP 118/78 | Ht 60.0 in | Wt 135.0 lb

## 2013-02-27 DIAGNOSIS — Z23 Encounter for immunization: Secondary | ICD-10-CM

## 2013-02-27 DIAGNOSIS — R635 Abnormal weight gain: Secondary | ICD-10-CM

## 2013-02-27 DIAGNOSIS — E785 Hyperlipidemia, unspecified: Secondary | ICD-10-CM

## 2013-02-27 DIAGNOSIS — E039 Hypothyroidism, unspecified: Secondary | ICD-10-CM

## 2013-02-27 DIAGNOSIS — Z01419 Encounter for gynecological examination (general) (routine) without abnormal findings: Secondary | ICD-10-CM

## 2013-02-27 LAB — CBC WITH DIFFERENTIAL/PLATELET
Basophils Absolute: 0 10*3/uL (ref 0.0–0.1)
Basophils Relative: 1 % (ref 0–1)
Eosinophils Absolute: 0.2 10*3/uL (ref 0.0–0.7)
Eosinophils Relative: 3 % (ref 0–5)
HCT: 41.4 % (ref 36.0–46.0)
Hemoglobin: 14.1 g/dL (ref 12.0–15.0)
Lymphocytes Relative: 35 % (ref 12–46)
Lymphs Abs: 1.9 10*3/uL (ref 0.7–4.0)
MCH: 30.7 pg (ref 26.0–34.0)
MCHC: 34.1 g/dL (ref 30.0–36.0)
MCV: 90.2 fL (ref 78.0–100.0)
Monocytes Absolute: 0.3 10*3/uL (ref 0.1–1.0)
Monocytes Relative: 6 % (ref 3–12)
Neutro Abs: 3 10*3/uL (ref 1.7–7.7)
Neutrophils Relative %: 55 % (ref 43–77)
Platelets: 286 10*3/uL (ref 150–400)
RBC: 4.59 MIL/uL (ref 3.87–5.11)
RDW: 12.8 % (ref 11.5–15.5)
WBC: 5.5 10*3/uL (ref 4.0–10.5)

## 2013-02-27 LAB — COMPREHENSIVE METABOLIC PANEL
ALT: 40 U/L — ABNORMAL HIGH (ref 0–35)
AST: 36 U/L (ref 0–37)
Albumin: 3.9 g/dL (ref 3.5–5.2)
Alkaline Phosphatase: 88 U/L (ref 39–117)
BUN: 12 mg/dL (ref 6–23)
CO2: 25 mEq/L (ref 19–32)
Calcium: 9.4 mg/dL (ref 8.4–10.5)
Chloride: 103 mEq/L (ref 96–112)
Creat: 0.64 mg/dL (ref 0.50–1.10)
Glucose, Bld: 84 mg/dL (ref 70–99)
Potassium: 4 mEq/L (ref 3.5–5.3)
Sodium: 137 mEq/L (ref 135–145)
Total Bilirubin: 0.4 mg/dL (ref 0.3–1.2)
Total Protein: 7.2 g/dL (ref 6.0–8.3)

## 2013-02-27 LAB — LIPID PANEL
Cholesterol: 205 mg/dL — ABNORMAL HIGH (ref 0–200)
HDL: 43 mg/dL (ref >39)
LDL Cholesterol: 124 mg/dL — ABNORMAL HIGH (ref 0–99)
Total CHOL/HDL Ratio: 4.8 Ratio
Triglycerides: 189 mg/dL — ABNORMAL HIGH (ref <150)
VLDL: 38 mg/dL (ref 0–40)

## 2013-02-27 LAB — TSH: TSH: 0.775 u[IU]/mL (ref 0.350–4.500)

## 2013-02-27 MED ORDER — BENZONATATE 200 MG PO CAPS: 200.0000 mg | ORAL_CAPSULE | Freq: Three times a day (TID) | ORAL | Status: DC | PRN

## 2013-02-27 NOTE — Progress Notes (Signed)
Tina Huffman 01/21/1971 [id number]   History:    42 y.o.  for annual gyn exam who was recently seen by Dr. Darnell Level general surgeon who has been monitoring the patient for left thyroid nodule. She has hypothyroidism and is on Synthroid 50 mcg daily. An ultrasound was ordered by him July of this year which once again demonstrated the stable large left thyroid nodule. He is going to repeat her ultrasound in 2 years. Review of patient's record also indicated she had hyperlipidemia and other providers have had her on statin which she discontinued. Last year she wanted to wait and see if exercise and diet would make a difference before she restart statins once again. She had been referred to the nutritionist last year. She is currently in fasting state today. She is using condoms for contraception. Cycles are reported to be normal. Patient with no prior history of abnormal Pap smears. Patient with past history of gestational diabetes.  Past medical history,surgical history, family history and social history were all reviewed and documented in the EPIC chart.  Gynecologic History Patient's last menstrual period was 02/19/2013. Contraception: condoms Last Pap: 2012. Results were: normal Last mammogram: 2013. Results were: normal  Obstetric History OB History   Grav Para Term Preterm Abortions TAB SAB Ect Mult Living   1 1 1       1      # Outc Date GA Lbr Len/2nd Wgt Sex Del Anes PTL Lv   1 TRM     F SVD  No Yes       ROS: A ROS was performed and pertinent positives and negatives are included in the history.  GENERAL: No fevers or chills. HEENT: No change in vision, no earache, sore throat or sinus congestion. NECK: No pain or stiffness. CARDIOVASCULAR: No chest pain or pressure. No palpitations. PULMONARY: No shortness of breath, cough or wheeze. GASTROINTESTINAL: No abdominal pain, nausea, vomiting or diarrhea, melena or bright red blood per rectum. GENITOURINARY: No urinary frequency, urgency,  hesitancy or dysuria. MUSCULOSKELETAL: No joint or muscle pain, no back pain, no recent trauma. DERMATOLOGIC: No rash, no itching, no lesions. ENDOCRINE: No polyuria, polydipsia, no heat or cold intolerance. No recent change in weight. HEMATOLOGICAL: No anemia or easy bruising or bleeding. NEUROLOGIC: No headache, seizures, numbness, tingling or weakness. PSYCHIATRIC: No depression, no loss of interest in normal activity or change in sleep pattern.    Seasonal allergies  Exam: chaperone present  BP 118/78  Ht 5' (1.524 m)  Wt 135 lb (61.236 kg)  BMI 26.37 kg/m2  LMP 02/19/2013  Body mass index is 26.37 kg/(m^2).  General appearance : Well developed well nourished female. No acute distress HEENT: Neck supple, trachea midline, no carotid bruits, no thyroidmegaly Lungs: Clear to auscultation, no rhonchi or wheezes, or rib retractions  Heart: Regular rate and rhythm, no murmurs or gallops Breast:Examined in sitting and supine position were symmetrical in appearance, no palpable masses or tenderness,  no skin retraction, no nipple inversion, no nipple discharge, no skin discoloration, no axillary or supraclavicular lymphadenopathy Abdomen: no palpable masses or tenderness, no rebound or guarding Extremities: no edema or skin discoloration or tenderness  Pelvic:  Bartholin, Urethra, Skene Glands: Within normal limits             Vagina: No gross lesions or discharge  Cervix: No gross lesions or discharge  Uterus  anteverted, normal size, shape and consistency, non-tender and mobile  Adnexa  Without masses or tenderness  Anus and perineum  normal   Rectovaginal  normal sphincter tone without palpated masses or tenderness             Hemoccult None indicated     Assessment/Plan:  42 y.o. female for annual exam with seasonal allergy occasional nonproductive cough. She will be prescribed Tessalon 200 mg capsule to take 1 by mouth 3 times a day when necessary. Pap smear not done this year in  adherence to the new guidelines. Patient was reminded to schedule a mammogram at the end of the year and to do her monthly breast exams. The following labs ordered today: CBC, comprehensive metabolic panel, TSH, fasting lipid profile and urinalysis. Patient did receive a Tdap vaccine today. Literature and information on immunizations was provided   Ok Edwards MD, 9:39 AM 02/27/2013

## 2013-02-27 NOTE — Patient Instructions (Addendum)
Vacuna difteria, tétanos, tos ferina (DTP) - Lo que debe saber   (Tetanus, Diphtheria, Pertussis [Tdap] Vaccine, What You Need to Know)  ¿PORQUÉ VACUNARSE?   El tétanos, la difteria y la tos ferina pueden ser enfermedades muy graves, aún en adolescentes y adultos. La vacuna Tdap nos puede proteger de estas enfermedades.   El TÉTANOS (Trismo) provoca la contracción dolorosa de los músculos, por lo general, en todo el cuerpo.   · Puede causar el endurecimiento de los músculos de la cabeza y el cuello, de modo que impide abrir la boca, tragar y en algunos casos, respirar. El tétanos causa la muerte de 1 de cada 5 personas que se infectan.  La DIFTERIA produce la formación de una membrana gruesa que cubre el fondo de la garganta.   · Puede causar problemas respiratorios, parálisis, insuficiencia cardíaca e incluso la muerte.  TOS FERINA (Pertusis) causa episodios de tos graves, que pueden hacer difícil la respiración, causar vómitos y trastornos del sueño.   · También puede ser la causa de pérdida de peso, incontinencia y fractura de costillas. Dos de cada 100 adolescentes y cinco de cada 100 adultos que enferman de pertusis deben ser hospitalizados, tienen complicaciones como la neumonía o mueren.  Estas enfermedades son provocadas por bacterias. La difteria y el pertusis se contagian de persona a persona a través de la tos o el estornudo. El tétanos ingresa al organismo a través de cortes, rasguños o heridas.   Antes de las vacunas, en los [place] se vieron más de 200.000 casos al año de difteria y tos ferina y cientos de casos de tétanos. Desde el inicio de la vacunación, los casos de tétanos y difteria han disminuido alrededor del 99% y los casos de tos ferina alrededor del 80%.   Tdap   La vacuna Tdap protege a adolescentes y adultos contra el tétanos, la difteria y la tos ferina. Una dosis de Tdap se administra a los 11 o 12 años de edad. Las personas que no recibieron la vacuna Tdap a esa edad deben  recibirla tan pronto como sea posible.   Es muy importante que los profesionales de la salud y todos aquellos que tengan contacto cercano con bebés menores de 12 meses reciban la Tdap.   Las mujeres embarazadas deben recibir una dosis de Tdap en cada embarazo, para proteger al recién nacido de la tos ferina. Los niños tienen mayor riesgo de complicaciones graves y potencialmente mortales debido a la tos ferina.   Una vacuna similar, llamada Td, protege contra el tétanos y la difteria, pero no contra la tos ferina. Cada 10 años debe recibirse un refuerzo de Td. La Tdap se puede administrar como uno de estos refuerzos, si todavía no ha recibido una dosis. También se puede aplicar después de un corte o quemadura grave para prevenir la infección por tétanos.   El médico le dará más información.   La Tdap puede administrarse de manera segura simultáneamente con otras vacunas.   ALGUNAS PERSONAS NO DEBEN RECIBIR ESTA VACUNA.   · Si alguna vez tuvo una reacción alérgica potencialmente mortal después de una dosis de la vacuna contra el tétanos, la diferia o la tos ferina, o tuvo una alergia grave a cualquiera de los componentes de esta vacuna, no debe aplicarse la vacuna. Informe a su médico si usted sufre algún tipo de alergia grave.  · Si estuvo en coma o sufrió múltiples convulsiones dentro de los 7 días posteriores después de una dosis de DTP o DTaP   no debe recibir la Tdap, salvo que se encuentre otra causa En este caso puede recibir la Td.  · Consulte con su médico si:  · tiene epilepsia u otra enfermedad del sistema nervioso,  · siente dolor intenso o se hincha después de recibir cualquier vacuna contra la difteria, el tétanos o la tos ferina,  · alguna vez ha sufrido el síndrome de Guillain-Barré,  · no se siente bien el día en que se ha programado la vacuna.  RIESGOS DE UNA REACCIÓN A LA VACUNA  Con cualquier medicamento, incluyendo las vacunas, existe la posibilidad de que aparezcan efectos secundarios. Estos son  leves y desaparecen por sí solos, pero también son posibles las reacciones graves.   Breves episodios de desmayo pueden seguir a una vacunación, causando lesiones por la caída. Sentarse o recostarse durante 15 minutos puede ayudar a evitarlo. Informe al médico si se siente mareado o aturdido, tiene cambios en la visión o zumbidos en los oídos.   Problemas leves luego de la Tdap (no interferirán con las actividades)   · Dolor en el sitio de la inyección (alrededor de 1 de cada 4 adolescentes o 2 de cada 3 adultos).  · Enrojecimiento o hinchazón en el lugar de la inyección (1 de cada 5 personas).  · Fiebre leve de al menos 100,4° F (38° C) (hasta alrededor de 1 cada 25 adolescentes y 1 de cada 100 adultos).  · Dolor de cabeza (3 o 4de cada 10 personas).  · Cansancio (1 de cada 3 o 4 personas).  · Náuseas, vómitos, diarrea, dolor de estómago (1 de cada 4 adolescentes o 1 de cada 10 adultos).  · Escalofríos, dolores corporales, dolor articular, erupciones, inflamación de las glándulas (poco frecuente).  Problemas moderados: (interfieren con las actividades, pero no requieren atención médica)   · Dolor en el lugar de la inyección (1 de cada 5 adolescentes o 1 de cada 100 adultos).  · Enrojecimiento o inflamación (1 de cada 16 adolescentes y 1 de cada 25 adultos).  · Fiebre de más de 102°F o 38,9°C (1 de cada 100 adolescentes o 1 de cada 250 adultos).  · Dolor de cabeza (alrededor de 4 de cada 20 adolescentes y 3 de cada 10 adultos).  · Náuseas, vómitos, diarrea, dolor de estómago (1 a 3 de cada 100 personas).  · Hinchazón de todo el brazo en el que se aplicó la vacuna (3 de cada100 personas).  Problemas graves: luego de la Tdap (no puede realizar las actividades habituales, requiere atención médica)   · Inflamación, dolor intenso, sangrado y enrojecimiento en el brazo, en el sitio de la inyección (poco frecuente).  Una reacción alérgica grave puede ocurrir después de la administración de cualquier vacuna (se estima en  menos de 1 en un millón de dosis).   ¿QUÉ PASA SI HAY UNA REACCIÓN GRAVE?   ¿Qué signos debo buscar?  · Observe todo lo que le preocupe, como signos de una reacción alérgica grave, fiebre muy alta o cambios en el comportamiento.  Los signos de una reacción alérgica grave pueden incluir urticaria, hinchazón de la cara y la garganta, dificultad para respirar, ritmo cardíaco acelerado, mareos y debilidad. Estos síntomas pueden comenzar entre unos pocos minutos y algunas horas después de la vacunación.   ¿Qué debo hacer?  · Si usted piensa que se trata de una reacción alérgica grave o de otra emergencia que no puede esperar, llame al 911 o lleve a la persona al hospital más cercano. De lo contrario, llame   a su médico.  · Después, la reacción debe informarse a la "Vaccine Adverse Event Reporting System" (Sistema de información sobre efectos adversos de las vacunas -VAERS). El médico o usted mismo pueden realizar el informe en el sitio web del VAERS www.vaers.hhs.govo llame al 1-800-822-7967.  El VAERS es sólo para informar reacciones. No brindan consejo médico.   PROGRAMA NACIONAL DE COMPENSACIÓN DE DAÑOS POR VACUNAS   El National Vaccine Injury Compensation Program (VICP) es un programa federal que fue creado para compensar a las personas que puedan haber sufrido daños al recibir ciertas vacunas.   Aquellas personas que consideren que han sufrido un daño como consecuencia de una vacuna y quieren saber más acerca del programa y como presentar una denuncia, pueden llamar 1-800-338-2382 o visite el sitio web del VICP en www.hrsa.gov/vaccinecompensation.   ¿CÓMO PUEDO OBTENER MÁS INFORMACIÓN?   · Consulte a su médico.  · Comuníquese con el servicio de salud de su localidad o su estado.  · Comuníquese con los Centros para el control y la prevención de enfermedades (Centers for Disease Control and Prevention , CDC).  · llamando al 1-800-232-4636 o visitando el sitio web del CDC en www.cdc.gov/vaccines.  CDC Tdap Vaccine VIS  (12/13/11)   Document Released: 07/09/2012  ExitCare® Patient Information ©2014 [employer].

## 2013-02-27 NOTE — Addendum Note (Signed)
Addended by: Bertram Savin A on: 02/27/2013 12:25 PM   Modules accepted: Orders

## 2013-02-28 LAB — URINALYSIS W MICROSCOPIC + REFLEX CULTURE
Bacteria, UA: NONE SEEN
Bilirubin Urine: NEGATIVE
Casts: NONE SEEN
Crystals: NONE SEEN
Hgb urine dipstick: NEGATIVE
Ketones, ur: NEGATIVE mg/dL
Specific Gravity, Urine: 1.018 (ref 1.005–1.030)
Urobilinogen, UA: 0.2 mg/dL (ref 0.0–1.0)

## 2013-03-02 ENCOUNTER — Other Ambulatory Visit: Payer: Self-pay | Admitting: Anesthesiology

## 2013-03-02 DIAGNOSIS — R748 Abnormal levels of other serum enzymes: Secondary | ICD-10-CM

## 2013-03-10 ENCOUNTER — Other Ambulatory Visit: Payer: Self-pay | Admitting: *Deleted

## 2013-03-10 ENCOUNTER — Other Ambulatory Visit: Payer: BC Managed Care – PPO

## 2013-03-11 ENCOUNTER — Other Ambulatory Visit: Payer: BC Managed Care – PPO

## 2013-03-11 DIAGNOSIS — R748 Abnormal levels of other serum enzymes: Secondary | ICD-10-CM

## 2013-03-17 ENCOUNTER — Encounter: Payer: Self-pay | Admitting: Gynecology

## 2013-03-17 ENCOUNTER — Ambulatory Visit (INDEPENDENT_AMBULATORY_CARE_PROVIDER_SITE_OTHER): Payer: BC Managed Care – PPO | Admitting: Gynecology

## 2013-03-17 DIAGNOSIS — E781 Pure hyperglyceridemia: Secondary | ICD-10-CM | POA: Insufficient documentation

## 2013-03-17 MED ORDER — ROSUVASTATIN CALCIUM 10 MG PO TABS: 10.0000 mg | ORAL_TABLET | Freq: Every day | ORAL | Status: DC

## 2013-03-17 MED ORDER — IBUPROFEN 800 MG PO TABS: 800.0000 mg | ORAL_TABLET | Freq: Three times a day (TID) | ORAL | Status: DC | PRN

## 2013-03-17 NOTE — Patient Instructions (Addendum)
Rosuvastatin Tablets Qu es este medicamento? La ROSUVASTATINA es conocido como un inhibidor de la HMG-CoA reductasa o 'estatias'. Reduce el nivel de colesterol y triglicridos en la sangre. Este medicamento tambin puede reducir el riesgo de Marine scientist ataques cardiacos, derrame cerebral u otros problemas de la salud en pacientes que corren el riesgo de padecer una enfermedad cardiaca. Marnee Guarneri y cambios a su estilo de vida son a menudo utilizados con Industrial/product designer. Este medicamento puede ser utilizado para otros usos; si tiene alguna pregunta consulte con su proveedor de atencin mdica o con su farmacutico. Qu le debo informar a mi profesional de la salud antes de tomar este medicamento? Necesita saber si usted presenta alguno de los siguientes problemas o situaciones: -ingerir bebidas alcohlicas con frecuencia -enfermedad renal -enfermedad heptica -dolores o debilidades musculares -otra condicin mdica -una reaccin alrgica o inusual a la rosuvastatina, a otros medicamentos, alimentos, colorantes o conservadores -si est embarazada o buscando quedar embarazada -si est amamantando a un beb Cmo debo utilizar este medicamento? Tome este medicamento por va oral con un vaso de agua. Siga las instrucciones de la etiqueta del [place]. Este medicamento se puede tomar con o sin alimentos. Tome sus dosis a intervalos regulares. No tome su medicamento con una frecuencia mayor a la indicada. Hable con su pediatra para informarse acerca del uso de este medicamento en nios. Aunque este medicamento ha sido recetado a nios tan menores como de 10 aos de edad para condiciones selectivas, las precauciones se aplican. Sobredosis: Pngase en contacto inmediatamente con un centro toxicolgico o una sala de urgencia si usted cree que haya tomado demasiado medicamento. ATENCIN: [employer] es solo para usted. No comparta este medicamento con nadie. Qu sucede si me olvido de una  dosis? Si olvida una dosis, tmela lo antes posible. Si es casi la hora de la prxima dosis, tome slo esa dosis. No tome dosis adicionales o dobles. Qu puede interactuar con este medicamento? No tome esta medicina con ninguno de los siguientes medicamentos: -medicamentos a base de hierbas, tales como levadura roja de arroz Esta medicina tambin puede interactuar con los siguientes medicamentos: -alcohol -anticidos que contienen hidrxido de aluminio o hidrxido de magnesio -ciclosporina -otros medicamentos para el colesterol alto -algunos medicamentos para la infeccin por VIH -warfarina Puede ser que esta lista no menciona todas las posibles interacciones. Informe a su profesional de [employer] de [employer] productos a base de hierbas, medicamentos de [place] o suplementos nutritivos que est tomando. Si usted fuma, consume bebidas alcohlicas o si utiliza drogas ilegales, indqueselo tambin a su profesional de [employer]. Algunas sustancias pueden interactuar con su medicamento. A qu debo estar atento al usar [employer]? Visite a su mdico o a su profesional de la salud para chequeos peridicos. Puede necesitar exmenes regulares para asegurarse de que el hgado est funcionando bien. Informe a su mdico o a su profesional de la salud tan pronto como pueda si tiene debilidad, sensibilidad o dolores musculares que no tienen explicacin, especialmente si tambin tiene fiebre y [place]. Este medicamento puede afectar su nivel de Banker. Si tiene diabetes, consulte a su mdico o a su profesional de la salud antes de cambiar su dieta o la dosis de su medicamento para la diabetes. Evite tomar anticidos que contengan aluminio, calcio o magnesio dentro de 2 horas de Financial risk analyst. Este medicamento es slo una parte de un programa integral para la salud de Programmer, multimedia. Su mdico o dietista pueden sugerirle una dieta con  bajo contenido de colesterol y de grasa para  ayudarle. Evite el alcohol y Art therapist, y [place] un programa adecuado de ejercicios fsicos. No utilice este medicamento si est embarazada o amamantando a un beb. Existe la posibilidad de efectos secundarios graves a un beb sin nacer o a Radio broadcast assistant. Para ms informacin hable con su mdico o su farmacutico. Qu efectos secundarios puedo tener al [employer] este medicamento? Efectos secundarios que debe informar a su mdico o a Producer, television/film/video de la salud tan pronto como sea posible: -Therapist, art como erupcin cutnea, picazn o urticarias, hinchazn de la cara, labios o lengua -orina de color oscuro -fiebre -dolores articulares -calambres, dolores musculares -enrojecimiento, formacin de ampollas, descamacin o distensin de la piel, inclusive dentro de la boca -dificultad para orinar o cambios en el volumen de orina -cansancio o debilidad inusual -color amarillento de ojos o piel Efectos secundarios que, por lo general, no requieren atencin mdica (debe informarlos a su mdico o a su profesional de la salud si persisten o si son molestos): -estreimiento -acidez de estmago -nuseas -gases, dolor, Dentist de [employer] ser que esta lista no menciona todos los posibles efectos secundarios. Comunquese a su mdico por asesoramiento mdico [employer]. Usted puede informar los efectos secundarios a la FDA por telfono al 1-800-FDA-1088. Dnde debo guardar mi medicina? Mantngala fuera del alcance de los nios. Gurdela a [employer], entre 20 y 25 grados C (71 y 29 grados F). Mantenga el envase bien cerrado (protjalo de la humedad). Deseche todo el medicamento que no haya utilizado, despus de la fecha de vencimiento. ATENCIN: Este folleto es un resumen. Puede ser que no cubra toda la posible informacin. Si usted tiene preguntas acerca de esta medicina, consulte con su mdico, su farmacutico o su profesional de Radiographer, therapeutic.  2013, [employer]vier[employer]. (02/13/2011 3:32:48 PM)                                                    Control del colesterol  Los niveles de colesterol en el organismo estn determinados significativamente por su dieta. Los niveles de colesterol tambin se relacionan con la enfermedad cardaca. El material que sigue ayuda a Software engineer relacin y a Chiropractor qu puede hacer para mantener su corazn sano. No todo el colesterol es [place]. Las lipoprotenas de baja densidad (LDL) forman el colesterol "malo". El colesterol malo puede ocasionar depsitos de grasa que se acumulan en el interior de las arterias. Las lipoprotenas de alta densidad (HDL) es el colesterol "bueno". Ayuda a remover el colesterol LDL "malo" de la [place]. El colesterol es un factor de riesgo muy importante para la enfermedad cardaca. Otros factores de riesgo son la hipertensin arterial, el hbito de fumar, el estrs, la herencia y [place].   El msculo cardaco obtiene el suministro de sangre a travs de las arterias coronarias. Si su colesterol LDL ("malo") est elevado y el HDL ("bueno") es bajo, tiene un factor de riesgo para que se formen depsitos de Holiday representative en las arterias coronarias (los vasos sanguneos que suministran sangre al corazn). Esto hace que haya menos lugar para que la sangre circule. Sin la suficiente sangre y oxgeno, el msculo cardaco no puede funcionar correctamente, y usted podr sentir dolores en el pecho (angina pectoris). Cuando una arteria coronaria se cierra completamente, una parte del msculo cardaco  puede morir (infarto de miocardio).  CONTROL DEL COLESTEROL Cuando el profesional que lo asiste enva la sangre al laboratorio para Artist nivel de colesterol, puede realizarle tambin un perfil completo de los lpidos. Con esta prueba, se puede determinar la cantidad total de colesterol, as como los niveles de LDL y HDL. Los triglicridos son un tipo de grasa que circula en la sangre y que tambin puede utilizarse para  determinar el riesgo de enfermedad cardaca. En la siguiente tabla se establecen los nmeros ideales: Prueba: Colesterol total  Menos de 200 mg/dl.  Prueba: LDL "colesterol malo"  Menos de 100 mg/dl.   Menos de 70 mg/dl si tiene riesgo muy elevado de sufrir un ataque cardaco o muerte cardaca sbita.  Prueba: HDL "colesterol bueno"  Mujeres: Ms de 50 mg/dl.   Hombres: Ms de 40 mg/dl.  Prueba: Trigliceridos  Menos de 150 mg/dl.    CONTROL DEL COLESTEROL CON DIETA Aunque factores como el ejercicio y el estilo de vida son importantes, la "primera lnea de ataque" es la dieta. Esto se debe a que se sabe que ciertos alimentos hacen subir el colesterol y otros lo [place]. El objetivo debe ser [employer] alimentos, de modo que tengan un efecto sobre el colesterol y, an ms importante, Microbiologist las grasas saturadas y trans con otros tipos de grasas, como las monoinsaturadas y las poliinsaturadas y cidos grasos omega-3 . En promedio, una persona no debe consumir ms de 15 a 17 g de grasas saturadas por [employer]. Las grasas saturadas y trans se consideran grasas "malas", ya que elevan el colesterol LDL. Las grasas saturadas se encuentran principalmente en productos animales como carne, Diamond y crema. Pero esto no significa que usted Marketing executive todas sus comidas favoritas. Actualmente, como lo muestra el cuadro que figura al final de este documento, hay sustitutos de buen sabor, bajos en grasas y en colesterol, para la mayora de los alimentos que a usted Musician. Elija aquellos alimentos alternativos que sean bajos en grasas o sin grasas. Elija cortes de carne del cuarto trasero o lomo ya que estos cortes son los que tienen menor cantidad de grasa y Oncologist. El pollo (sin piel), el pescado, la carne de ternera, y la [place] de [place] molida son excelentes opciones. Elimine las carnes [employer] o el salami. Los [employer] o nada de grasas saturadas. Cuando  consuma carne [place], carne de aves de corral, o pescado, hgalo en porciones de 85 gramos (3 onzas). Las grasas trans tambin se llaman "aceites parcialmente hidrogenados". Son aceites manipulados cientficamente de [place] que son slidos a Publishing rights manager, tienen una larga vida y Glass blower/designer sabor y la textura de los alimentos a los que se Scientist, clinical (histocompatibility and immunogenetics). Las grasas trans se encuentran en la [place], [place], crackers y alimentos horneados.  Para hornear y cocinar, el aceite es un excelente sustituto para la [place]. Los aceites monoinsaturados tienen un beneficio particular, ya que se cree que disminuyen el colesterol LDL (colesterol malo) y elevan el HDL. Deber evitar los aceites tropicales saturados como el de coco y el de [place].  Recuerde, adems, que puede comer sin restricciones los grupos de alimentos que son naturalmente libres de grasas saturadas y Neurosurgeon trans, entre los que se incluyen el pescado, las frutas (excepto el aguacate), verduras, frijoles, cereales (cebada, arroz, [place], trigo) y las pastas (sin salsas con crema)   IDENTIFIQUE LOS ALIMENTOS QUE DISMINUYEN EL COLESTEROL  Pueden disminuir el colesterol las fibras solubles que estn en las  frutas, como las [address], en los vegetales como el brcoli, las patatas y las zanahorias; en las legumbres como frijoles, guisantes y Therapist, occupational; y en los cereales como la cebada. Los alimentos fortificados con fitosteroles tambin Engineer, production. Debe consumir al menos 2 g de estos alimentos a diario para Financial planner de disminucin de [place].  En el supermercado, lea las etiquetas de los envases para identificar los alimentos bajos en grasas saturadas, libres de grasas trans y bajos en [place], . Elija quesos que tengan solo de 2 a 3 g de grasa saturada por onza (28,35 g). Use una margarina que no dae el corazn, [place] de grasas trans o aceite parcialmente hidrogenado. Al comprar alimentos horneados (galletitas dulces y  [place]) evite el aceite parcialmente hidrogenado. Los panes y bollos debern ser de granos enteros (harina de maz o de avena entera, en lugar de "harina" o "harina enriquecida"). Compre sopas en lata que no sean cremosas, con bajo contenido de sal y sin grasas adicionadas.   TCNICAS DE PREPARACIN DE LOS ALIMENTOS  Nunca fra los alimentos en aceite abundante. Si debe frer, hgalo en poco aceite y removiendo [place], porque as se utilizan muy pocas grasas, o utilice un spray antiadherente. Cuando le sea posible, hierva, hornee o ase las carnes y cocine los vegetales al vapor. En vez de [employer] con mantequilla o [place], utilice limn y hierbas, pur de Psychologist, educational y canela (para las calabazas y batatas), yogurt y salsa descremados y aderezos para ensaladas bajos en contenido graso.   BAJO EN GRASAS SATURADAS / SUSTITUTOS BAJOS EN GRASA  Carnes / Grasas saturadas (g)  Evite: Bife, corte graso (3 oz/85 g) / 11 g   Elija: Bife, corte magro (3 oz/85 g) / 4 g   Evite: [place]uesa (3 oz/85 g) / 7 g   Elija:  [place] magra (3 oz/85 g) / 5 g   Evite: Jamn (3 oz/85 g) / 6 g   Elija:  Jamn magro (3 oz/85 g) / 2.4 g   Evite: Pollo, con piel (3 oz/85 g), Carne oscura / 4 g   Elija:  Pollo, sin piel (3 oz/85 g), Carne oscura / 2 g   Evite: Pollo, con piel (3 oz/85 g), Carne magra / 2.5 g   Elija: Pollo, sin piel (3 oz/85 g), Carne magra / 1 g  Lcteos / Grasas saturadas (g)  Evite: Leche entera (1 taza) / 5 g   Elija: Leche con bajo contenido de grasa, 2% (1 taza) / 3 g   Elija: Leche con bajo contenido de grasa, 1% (1 taza) / 1.5 g   Elija: Leche descremada (1 taza) / 0.3 g   Evite: Queso duro (1 oz/28 g) / 6 g   Elija: Queso descremado (1 oz/28 g) / 2-3 g   Evite: Queso cottage, 4% grasa (1 taza)/ 6.5 g   Elija: Queso cottage con bajo contenido de grasa, 1% grasa (1 taza)/ 1.5 g   Evite: Helado (1 taza) / 9 g   Elija: Sorbete (1 taza) / 2.5 g    Elija: Yogurt helado sin contenido de grasa (1 taza) / 0.3 g   Elija: Barras de fruta congeladas / vestigios   Evite: Crema batida (1 cucharada) / 3.5 g   Elija: Batidos glac sin lcteos (1 cucharada) / 1 g  Condimentos / Grasas saturadas (g)  Evite: Mayonesa (1 cucharada) / 2 g   Elija: Mayonesa con bajo contenido de grasa (1 cucharada) / 1 g  Evite: [place] (1 cucharada) / 7 g   Elija: Margarina extra light (1 cucharada) / 1 g   Evite: Aceite de coco (1 cucharada) / 11.8 g   Elija: Aceite de oliva (1 cucharada) / 1.8 g   Elija: Aceite de maz (1 cucharada) / 1.7 g   Elija: Aceite de crtamo (1 cucharada) / 1.2 g   Elija: Aceite de girasol (1 cucharada) / 1.4 g   Elija: Aceite de soja (1 cucharada) / 2.4 g   Elija: Aceite de canola (1 cucharada) / 1 g  Document Released: 07/23/2005 Document Revised: 04/04/2011 [facility] Patient Information 2012 [place], [place].

## 2013-03-17 NOTE — Progress Notes (Signed)
Patient was seen recently in the office for her and her gynecological examination and presented to the office today to discuss her recent lipid profile. Patient has had history of hyperlipidemia and had been started on Crestor which she took for a very short time and discontinued it on her own. She had been referred to the nutritionist which she did follow through and had been exercising but still has abnormal lipid profile. Lipid profile this year when compared to last year as follows:  Results for DAVIONA, HERBERT (MRN [id number]) as of 03/17/2013 15:50  Ref. Range 02/27/2012 09:31 02/27/2013 09:33  Cholesterol Latest Range: 0-200 mg/dL 409 (H) 811 (H)  Triglycerides Latest Range: <150 mg/dL 914 (H) 782 (H)  HDL Latest Range: >39 mg/dL 40 43  LDL (calc) Latest Range: 0-99 mg/dL 956 (H) 213 (H)  VLDL Latest Range: 0-40 mg/dL 38 38  Total CHOL/HDL Ratio No range found 5.2 4.8   Despite the lifetime changes she continues to have hypertriglyceridemia and I have recommended that she be started back on Crestor 10 mg 1 by mouth daily. She had normal liver function tests on 02/27/2013. She is using condoms for contraception. The risks benefits and pros and cons of statins were discussed and literature and information was provided. She will return back in 6 months for followup lipid profile and liver function tests.her recent CBC and TSH and blood sugar were normal.

## 2013-07-13 ENCOUNTER — Encounter: Payer: Self-pay | Admitting: Gynecology

## 2013-09-29 ENCOUNTER — Other Ambulatory Visit: Payer: BC Managed Care – PPO

## 2013-10-07 ENCOUNTER — Other Ambulatory Visit: Payer: BC Managed Care – PPO

## 2013-10-07 DIAGNOSIS — E781 Pure hyperglyceridemia: Secondary | ICD-10-CM

## 2013-10-07 LAB — LIPID PANEL
CHOL/HDL RATIO: 2.7 ratio
Cholesterol: 104 mg/dL (ref 0–200)
HDL: 39 mg/dL — AB (ref >39)
LDL CALC: 43 mg/dL (ref 0–99)
TRIGLYCERIDES: 112 mg/dL (ref <150)
VLDL: 22 mg/dL (ref 0–40)

## 2013-10-07 LAB — ALT: ALT: 37 U/L — ABNORMAL HIGH (ref 0–35)

## 2013-10-07 LAB — AST: AST: 30 U/L (ref 0–37)

## 2014-01-14 ENCOUNTER — Other Ambulatory Visit (INDEPENDENT_AMBULATORY_CARE_PROVIDER_SITE_OTHER): Payer: Self-pay

## 2014-01-14 DIAGNOSIS — E039 Hypothyroidism, unspecified: Secondary | ICD-10-CM

## 2014-01-14 MED ORDER — LEVOTHYROXINE SODIUM 50 MCG PO TABS: 50.0000 ug | ORAL_TABLET | Freq: Every day | ORAL | Status: DC

## 2014-01-20 ENCOUNTER — Other Ambulatory Visit (INDEPENDENT_AMBULATORY_CARE_PROVIDER_SITE_OTHER): Payer: Self-pay

## 2014-01-20 ENCOUNTER — Telehealth (INDEPENDENT_AMBULATORY_CARE_PROVIDER_SITE_OTHER): Payer: Self-pay

## 2014-01-20 DIAGNOSIS — E039 Hypothyroidism, unspecified: Secondary | ICD-10-CM

## 2014-01-20 MED ORDER — LEVOTHYROXINE SODIUM 50 MCG PO TABS: 50.0000 ug | ORAL_TABLET | Freq: Every day | ORAL | Status: AC

## 2014-01-20 NOTE — Telephone Encounter (Signed)
Refill request for Levothyroxin received and [facility] [place] via echart per Dr Gerrit FriendsGerkin.

## 2014-03-03 ENCOUNTER — Encounter: Payer: BC Managed Care – PPO | Admitting: Gynecology

## 2014-03-12 ENCOUNTER — Encounter: Payer: Self-pay | Admitting: Gynecology

## 2014-03-12 ENCOUNTER — Ambulatory Visit (INDEPENDENT_AMBULATORY_CARE_PROVIDER_SITE_OTHER): Payer: BC Managed Care – PPO | Admitting: Gynecology

## 2014-03-12 ENCOUNTER — Other Ambulatory Visit (HOSPITAL_COMMUNITY)
Admission: RE | Admit: 2014-03-12 | Discharge: 2014-03-12 | Disposition: A | Discharge status: Home / Self Care | Payer: BC Managed Care – PPO | Source: Ambulatory Visit | Attending: Gynecology | Admitting: Gynecology

## 2014-03-12 VITALS — BP 122/76 | Ht 60.0 in | Wt 131.4 lb

## 2014-03-12 DIAGNOSIS — Z8639 Personal history of other endocrine, nutritional and metabolic disease: Secondary | ICD-10-CM

## 2014-03-12 DIAGNOSIS — Z1151 Encounter for screening for human papillomavirus (HPV): Secondary | ICD-10-CM | POA: Insufficient documentation

## 2014-03-12 DIAGNOSIS — Z01419 Encounter for gynecological examination (general) (routine) without abnormal findings: Secondary | ICD-10-CM

## 2014-03-12 DIAGNOSIS — E785 Hyperlipidemia, unspecified: Secondary | ICD-10-CM

## 2014-03-12 DIAGNOSIS — E039 Hypothyroidism, unspecified: Secondary | ICD-10-CM

## 2014-03-12 DIAGNOSIS — M2550 Pain in unspecified joint: Secondary | ICD-10-CM | POA: Insufficient documentation

## 2014-03-12 DIAGNOSIS — Z862 Personal history of diseases of the blood and blood-forming organs and certain disorders involving the immune mechanism: Secondary | ICD-10-CM

## 2014-03-12 LAB — CBC WITH DIFFERENTIAL/PLATELET
BASOS ABS: 0.1 10*3/uL (ref 0.0–0.1)
Basophils Relative: 1 % (ref 0–1)
Eosinophils Absolute: 0.3 10*3/uL (ref 0.0–0.7)
Eosinophils Relative: 5 % (ref 0–5)
HCT: 39.9 % (ref 36.0–46.0)
Hemoglobin: 13.8 g/dL (ref 12.0–15.0)
LYMPHS PCT: 29 % (ref 12–46)
Lymphs Abs: 1.8 10*3/uL (ref 0.7–4.0)
MCH: 30.5 pg (ref 26.0–34.0)
MCHC: 34.6 g/dL (ref 30.0–36.0)
MCV: 88.3 fL (ref 78.0–100.0)
Monocytes Absolute: 0.6 10*3/uL (ref 0.1–1.0)
Monocytes Relative: 9 % (ref 3–12)
NEUTROS ABS: 3.5 10*3/uL (ref 1.7–7.7)
Neutrophils Relative %: 56 % (ref 43–77)
Platelets: 191 10*3/uL (ref 150–400)
RBC: 4.52 MIL/uL (ref 3.87–5.11)
RDW: 13 % (ref 11.5–15.5)
WBC: 6.2 10*3/uL (ref 4.0–10.5)

## 2014-03-12 LAB — LIPID PANEL
CHOL/HDL RATIO: 2.1 ratio
Cholesterol: 112 mg/dL (ref 0–200)
HDL: 53 mg/dL (ref >39)
LDL Cholesterol: 35 mg/dL (ref 0–99)
Triglycerides: 122 mg/dL (ref <150)
VLDL: 24 mg/dL (ref 0–40)

## 2014-03-12 LAB — COMPREHENSIVE METABOLIC PANEL
ALBUMIN: 4.4 g/dL (ref 3.5–5.2)
ALT: 112 U/L — AB (ref 0–35)
AST: 81 U/L — AB (ref 0–37)
Alkaline Phosphatase: 72 U/L (ref 39–117)
BUN: 11 mg/dL (ref 6–23)
CHLORIDE: 105 meq/L (ref 96–112)
CO2: 24 mEq/L (ref 19–32)
CREATININE: 0.61 mg/dL (ref 0.50–1.10)
Calcium: 9.4 mg/dL (ref 8.4–10.5)
Glucose, Bld: 95 mg/dL (ref 70–99)
POTASSIUM: 3.8 meq/L (ref 3.5–5.3)
Sodium: 137 mEq/L (ref 135–145)
Total Bilirubin: 0.6 mg/dL (ref 0.2–1.2)
Total Protein: 7.3 g/dL (ref 6.0–8.3)

## 2014-03-12 LAB — TSH: TSH: 0.494 u[IU]/mL (ref 0.350–4.500)

## 2014-03-12 LAB — RHEUMATOID FACTOR

## 2014-03-12 MED ORDER — ROSUVASTATIN CALCIUM 10 MG PO TABS
10.0000 mg | ORAL_TABLET | Freq: Every day | ORAL | Status: DC
Start: 2014-03-12 — End: 2014-03-17

## 2014-03-12 MED ORDER — IBUPROFEN 800 MG PO TABS: 800.0000 mg | ORAL_TABLET | Freq: Three times a day (TID) | ORAL | Status: DC | PRN

## 2014-03-12 NOTE — Progress Notes (Signed)
Tina BumpersLorena Huffman 26-Jun-1971 [id number]   History:    43 y.o.  for annual gyn exam with complaining of occasional joint pains.Patient has had history of hyperlipidemia and had been started on Crestor which she took for a very short time and discontinued it on her own. She had been referred to the nutritionist which she did follow through and had been exercising but still has abnormal lipid profile. She restarted Crestor in August of 2014 again. She has fasting today. Patient has been followed byDr. Darnell Levelodd Gerkin general surgeon who has been monitoring the patient for left thyroid nodule. She has hypothyroidism and is on Synthroid 50 mcg daily. An ultrasound was ordered by him July of 2014 once again demonstrated the stable large left thyroid nodule. He is going to repeat her ultrasound in 2 years.She is using condoms for contraception. Cycles are reported to be normal. Patient with no prior history of abnormal Pap smears. Patient with past history of gestational diabetes.   Patient denies any photosensitivity or any skin rashes.   Past medical history,surgical history, family history and social history were all reviewed and documented in the EPIC chart.  Gynecologic History Patient's last menstrual period was 02/17/2014. Contraception: condoms Last Pap: 2012. Results were: normal Last mammogram: 2014. Results were: normal  Obstetric History OB History  Gravida Para Term Preterm AB SAB TAB Ectopic Multiple Living  1 1 1       1     # Outcome Date GA Lbr Len/2nd Weight Sex Delivery Anes PTL Lv  1 TRM     F SVD  N Y       ROS: A ROS was performed and pertinent positives and negatives are included in the history.  GENERAL: No fevers or chills. HEENT: No change in vision, no earache, sore throat or sinus congestion. NECK: No pain or stiffness. CARDIOVASCULAR: No chest pain or pressure. No palpitations. PULMONARY: No shortness of breath, cough or wheeze. GASTROINTESTINAL: No abdominal pain,  nausea, vomiting or diarrhea, melena or bright red blood per rectum. GENITOURINARY: No urinary frequency, urgency, hesitancy or dysuria. MUSCULOSKELETAL: No joint or muscle pain, no back pain, no recent trauma. DERMATOLOGIC: No rash, no itching, no lesions. ENDOCRINE: No polyuria, polydipsia, no heat or cold intolerance. No recent change in weight. HEMATOLOGICAL: No anemia or easy bruising or bleeding. NEUROLOGIC: No headache, seizures, numbness, tingling or weakness. PSYCHIATRIC: No depression, no loss of interest in normal activity or change in sleep pattern.     Exam: chaperone present  BP 122/76  Ht 5' (1.524 m)  Wt 131 lb 6.4 oz (59.603 kg)  BMI 25.66 kg/m2  LMP 02/17/2014  Body mass index is 25.66 kg/(m^2).  General appearance : Well developed well nourished female. No acute distress HEENT: Left inferior pole of thyroid with a nodule Lungs: Clear to auscultation, no rhonchi or wheezes, or rib retractions  Heart: Regular rate and rhythm, no murmurs or gallops Breast:Examined in sitting and supine position were symmetrical in appearance, no palpable masses or tenderness,  no skin retraction, no nipple inversion, no nipple discharge, no skin discoloration, no axillary or supraclavicular lymphadenopathy Abdomen: no palpable masses or tenderness, no rebound or guarding Extremities: no edema or skin discoloration or tenderness  Pelvic:  Bartholin, Urethra, Skene Glands: Within normal limits             Vagina: No gross lesions or discharge  Cervix: No gross lesions or discharge  Uterus  anteverted, normal size, shape and consistency, non-tender and mobile  Adnexa  Without masses or tenderness  Anus and perineum  normal   Rectovaginal  normal sphincter tone without palpated masses or tenderness             Hemoccult that indicated     Assessment/Plan:  43 y.o. female for annual exam with hyperlipidemia. Patient restarted her Crestor last year. No specific joint pains occasionally  could be secondary to Crestor? Will check also for possibility of collagen vascular disease such as with an ANA and will also check a  rheumatoid factor to rule out rheumatoid arthritis. We will also be checking her CBC, fasting lipid profile, complete metabolic panel, TSH, CBC and urinalysis. Pap smear was done today. Patient was reminded to follow up with the general surgeon in reference to her thyroid nodule that has been following. She will continue her Synthroid 50 mcg she was doing before.  Note: This dictation was prepared with  Dragon/digital dictation along withSmart phrase technology. Any transcriptional errors that result from this process are unintentional.   Ok Edwards MD, 9:27 AM 03/12/2014

## 2014-03-13 LAB — URINALYSIS W MICROSCOPIC + REFLEX CULTURE
BACTERIA UA: NONE SEEN
Bilirubin Urine: NEGATIVE
Casts: NONE SEEN
Crystals: NONE SEEN
Glucose, UA: NEGATIVE mg/dL
HGB URINE DIPSTICK: NEGATIVE
KETONES UR: NEGATIVE mg/dL
Leukocytes, UA: NEGATIVE
Nitrite: NEGATIVE
Protein, ur: NEGATIVE mg/dL
Specific Gravity, Urine: 1.012 (ref 1.005–1.030)
UROBILINOGEN UA: 0.2 mg/dL (ref 0.0–1.0)
pH: 8.5 — ABNORMAL HIGH (ref 5.0–8.0)

## 2014-03-15 LAB — ANA: ANA: NEGATIVE

## 2014-03-15 LAB — CYTOLOGY - PAP

## 2014-03-17 ENCOUNTER — Encounter: Payer: Self-pay | Admitting: Gynecology

## 2014-03-17 ENCOUNTER — Ambulatory Visit (INDEPENDENT_AMBULATORY_CARE_PROVIDER_SITE_OTHER): Payer: BC Managed Care – PPO | Admitting: Gynecology

## 2014-03-17 VITALS — BP 118/76

## 2014-03-17 DIAGNOSIS — G720 Drug-induced myopathy: Secondary | ICD-10-CM

## 2014-03-17 DIAGNOSIS — R945 Abnormal results of liver function studies: Principal | ICD-10-CM

## 2014-03-17 DIAGNOSIS — R7989 Other specified abnormal findings of blood chemistry: Secondary | ICD-10-CM

## 2014-03-17 DIAGNOSIS — G722 Myopathy due to other toxic agents: Secondary | ICD-10-CM

## 2014-03-17 DIAGNOSIS — E785 Hyperlipidemia, unspecified: Secondary | ICD-10-CM

## 2014-03-17 DIAGNOSIS — T466X5A Adverse effect of antihyperlipidemic and antiarteriosclerotic drugs, initial encounter: Secondary | ICD-10-CM

## 2014-03-17 NOTE — Progress Notes (Signed)
[facility]  43 year old patient that presented to the office a day to discuss her recent blood work that was done at time of her annual exam. Patient with history of hyperlipidemia has been now 1 year on Crestor 10 mg daily. Her recent lipid profile and liver function tests as follows:  Results for Sampson GoonMELGAR, Markel (MRN [id number]) as of 03/17/2014 08:16  Ref. Range 03/12/2014 09:23  Cholesterol Latest Range: 0-200 mg/dL 409112  Triglycerides Latest Range: <150 mg/dL 811122  HDL Latest Range: >39 mg/dL 53  LDL (calc) Latest Range: 0-99 mg/dL 35  VLDL Latest Range: 0-40 mg/dL 24  Total CHOL/HDL Ratio No range found 2.1   Results for Sampson GoonMELGAR, Doaa (MRN [id number]) as of 03/17/2014 08:16  Ref. Range 10/07/2013 08:35 03/12/2014 00:00 03/12/2014 09:23  AST Latest Range: 0-37 U/L 30  81 (H)  ALT Latest Range: 0-35 U/L 37 (H)  112 (H)    Patient has been complaining of joint discomfort and muscular tiredness and fatigue as well. We had a lengthy discussion before initiating medication as well as now on potential adverse reactions and side effects from Crestor to include myalgia, asthenia hepatotoxicity and pancreatitis as well as diabetes. Patient has engaged more frequent now on exercise and is watching her diet.  Assessment/plan: History of hyperlipidemia controlled with Crestor but developed side effects such as arthralgia myalgia will discontinue medication. Patient to continue on exercise and diet. She will return back in the first week of December for a fasting lipid profile as well as comprehensive metabolic panel to followup on her liver function tests. If her lipid profile becomes abnormal again that we will look at other treatment options with less side effects. All the above was discussed in Spanish with the patient detail all questions are answered will follow accordingly.

## 2014-06-07 ENCOUNTER — Encounter: Payer: Self-pay | Admitting: Gynecology

## 2014-07-23 ENCOUNTER — Telehealth: Payer: Self-pay | Admitting: *Deleted

## 2014-07-23 DIAGNOSIS — E781 Pure hyperglyceridemia: Secondary | ICD-10-CM

## 2014-07-23 NOTE — Telephone Encounter (Signed)
-----   Message from Jerilynn Mageslaudia Shaffer sent at 07/22/2014  4:33 PM EST ----- Regarding: lab order  "She will return back in the first week of December for a fasting lipid profile as well as comprehensive metabolic panel to followup on her liver function tests." Patient coming in on Monday Dec 21 for labs. Can you please enter orders. Thx

## 2014-07-23 NOTE — Telephone Encounter (Signed)
Orders placed.

## 2014-07-26 ENCOUNTER — Other Ambulatory Visit: Payer: Self-pay | Admitting: Gynecology

## 2014-07-26 ENCOUNTER — Other Ambulatory Visit: Payer: BC Managed Care – PPO

## 2014-07-26 DIAGNOSIS — E781 Pure hyperglyceridemia: Secondary | ICD-10-CM

## 2014-07-26 DIAGNOSIS — E7800 Pure hypercholesterolemia, unspecified: Secondary | ICD-10-CM

## 2014-07-26 LAB — COMPREHENSIVE METABOLIC PANEL
ALT: 18 U/L (ref 0–35)
AST: 21 U/L (ref 0–37)
Albumin: 4.2 g/dL (ref 3.5–5.2)
Alkaline Phosphatase: 85 U/L (ref 39–117)
BUN: 9 mg/dL (ref 6–23)
CHLORIDE: 103 meq/L (ref 96–112)
CO2: 27 meq/L (ref 19–32)
CREATININE: 0.62 mg/dL (ref 0.50–1.10)
Calcium: 9.1 mg/dL (ref 8.4–10.5)
Glucose, Bld: 93 mg/dL (ref 70–99)
Potassium: 3.8 mEq/L (ref 3.5–5.3)
Sodium: 137 mEq/L (ref 135–145)
Total Bilirubin: 0.5 mg/dL (ref 0.2–1.2)
Total Protein: 7 g/dL (ref 6.0–8.3)

## 2014-07-26 LAB — LIPID PANEL
CHOLESTEROL: 159 mg/dL (ref 0–200)
HDL: 40 mg/dL (ref >39)
LDL CALC: 84 mg/dL (ref 0–99)
TRIGLYCERIDES: 177 mg/dL — AB (ref <150)
Total CHOL/HDL Ratio: 4 Ratio
VLDL: 35 mg/dL (ref 0–40)

## 2014-07-26 LAB — ALT: ALT: 18 U/L (ref 0–35)

## 2014-07-26 LAB — AST: AST: 21 U/L (ref 0–37)

## 2014-08-10 ENCOUNTER — Encounter: Payer: Self-pay | Admitting: Gynecology

## 2014-10-11 ENCOUNTER — Other Ambulatory Visit (INDEPENDENT_AMBULATORY_CARE_PROVIDER_SITE_OTHER): Payer: Self-pay | Admitting: *Deleted

## 2014-10-11 DIAGNOSIS — E041 Nontoxic single thyroid nodule: Secondary | ICD-10-CM

## 2014-11-19 ENCOUNTER — Ambulatory Visit
Admission: RE | Admit: 2014-11-19 | Discharge: 2014-11-19 | Disposition: A | Discharge status: Home / Self Care | Payer: Self-pay | Source: Ambulatory Visit | Attending: Surgery | Admitting: Surgery

## 2014-12-09 IMAGING — US US SOFT TISSUE HEAD/NECK
1 series · 14 of 25 positions shown · non-contrast
Comparison: 12/13/2011

CLINICAL DATA: Follow-up thyroid nodule

THYROID ULTRASOUND
TECHNIQUE: Ultrasound examination of the thyroid gland and adjacent
soft tissues was performed.

[Series 1: us soft tissue head/neck · 0.07mm/px · 14 of 37 slices shown]
[im 1/37]
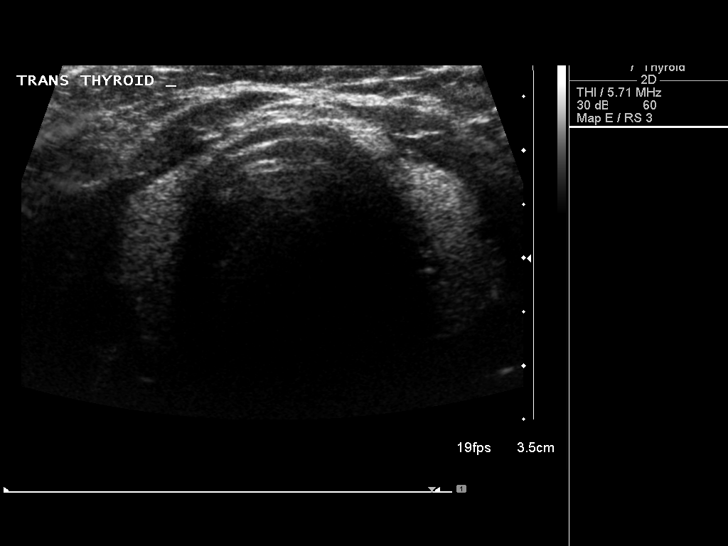
[im 4/37]
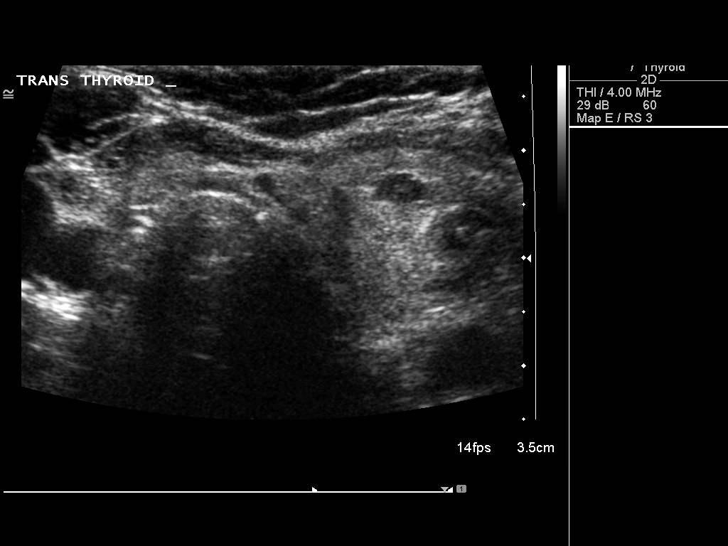
[im 7/37]
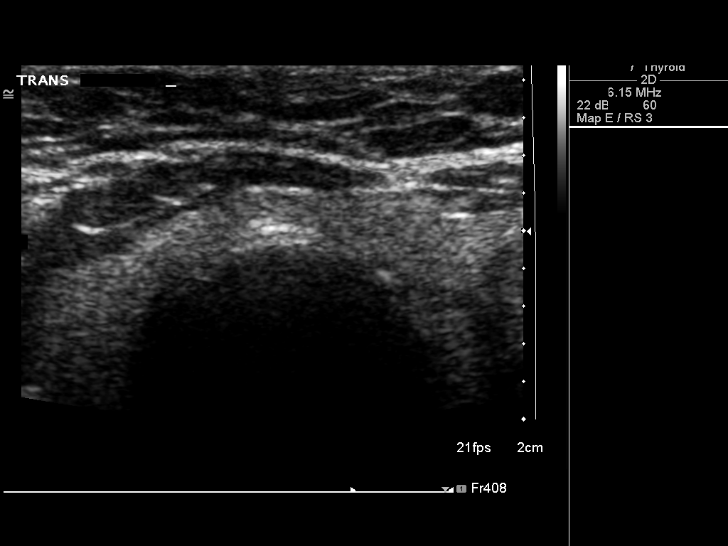
[im 10/37]
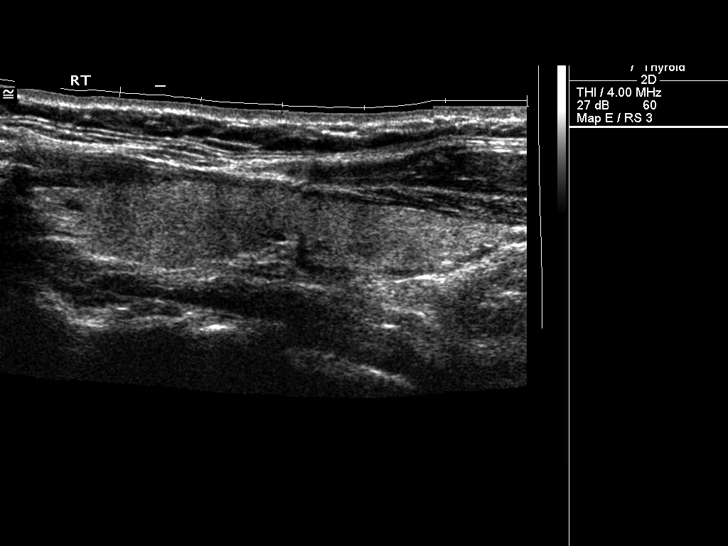
[im 13/37]
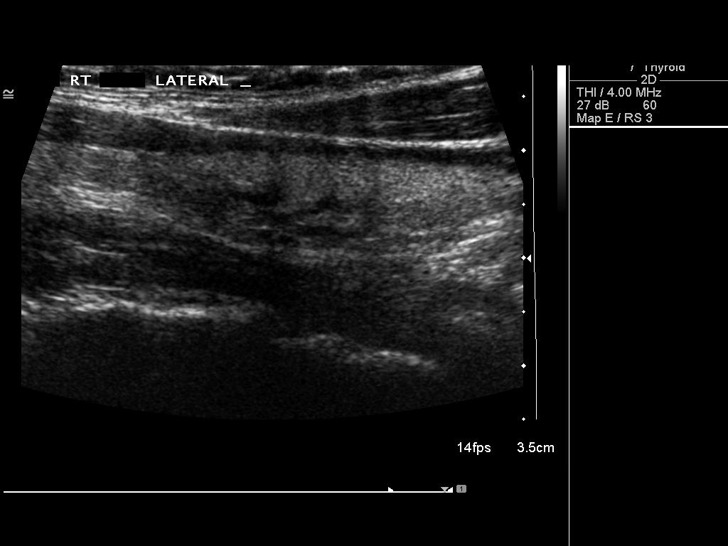
[im 14/37]
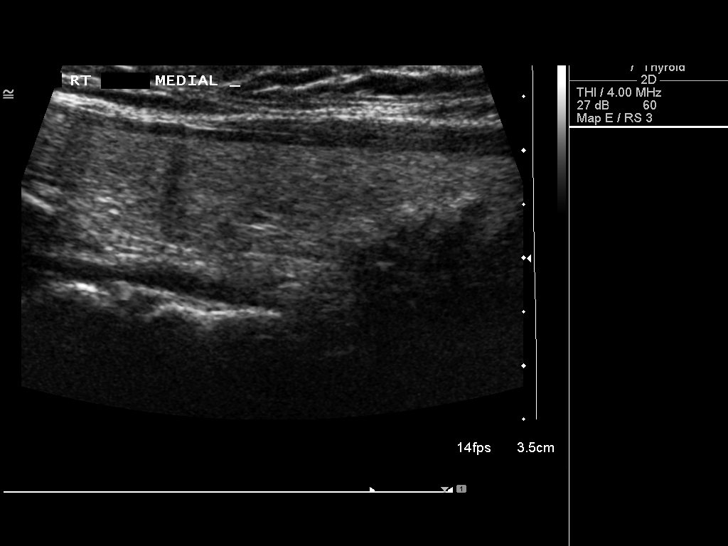
[im 17/37]
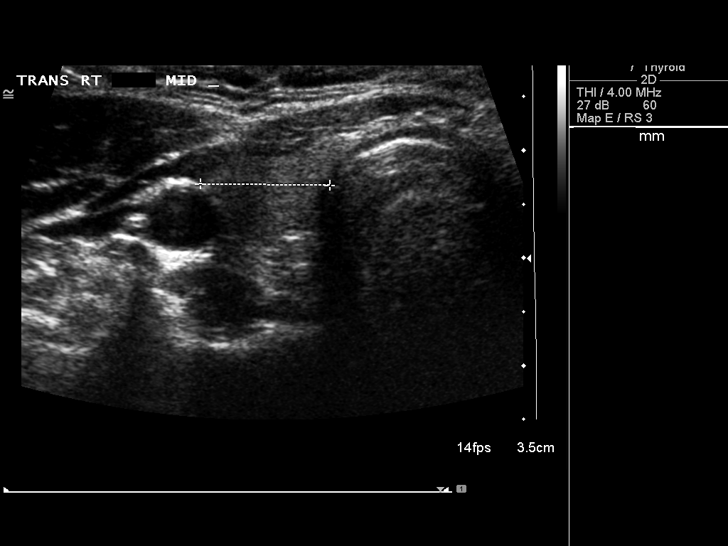
[im 20/37]
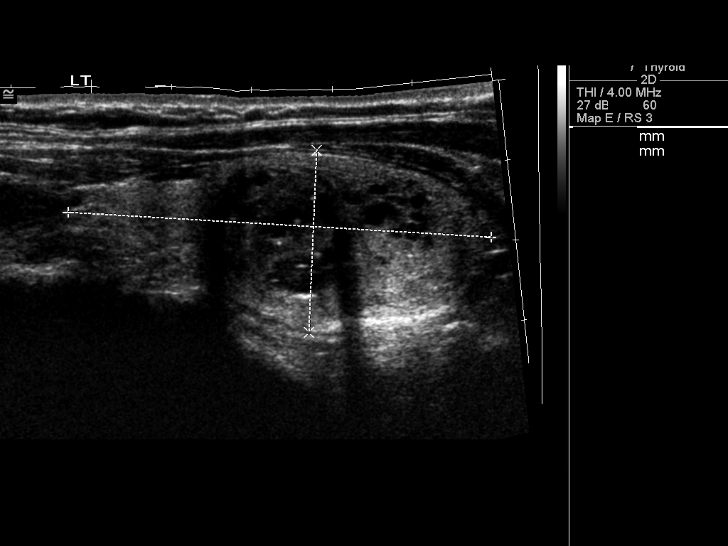
[im 23/37]
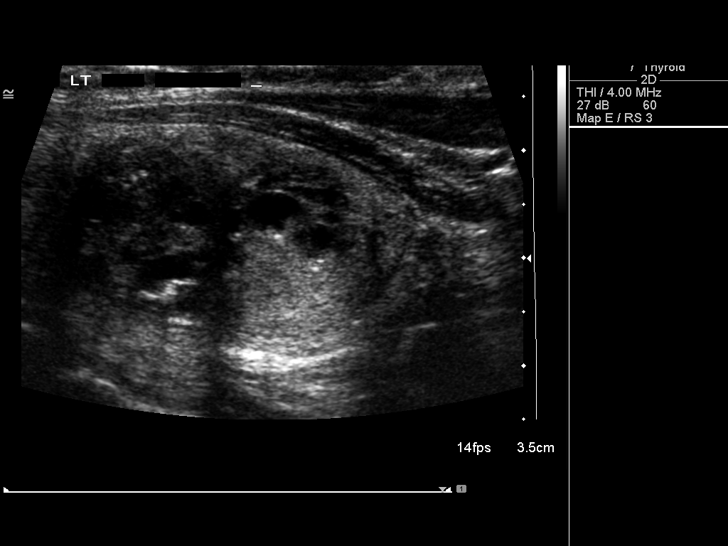
[im 25/37]
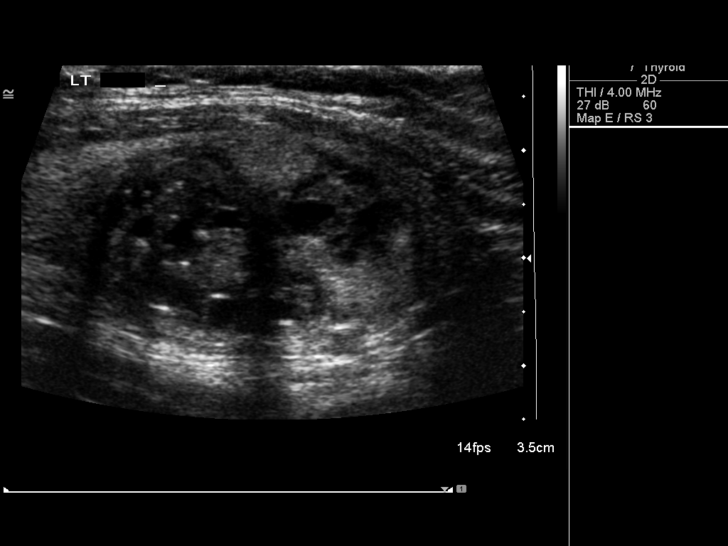
[im 28/37]
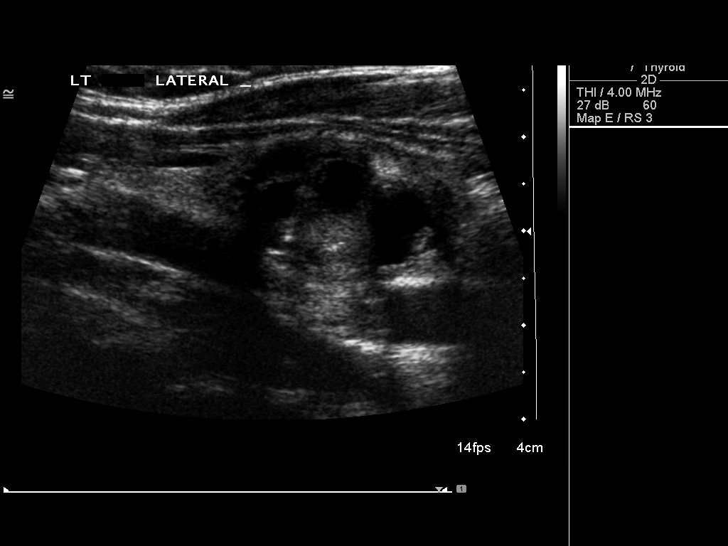
[im 31/37]
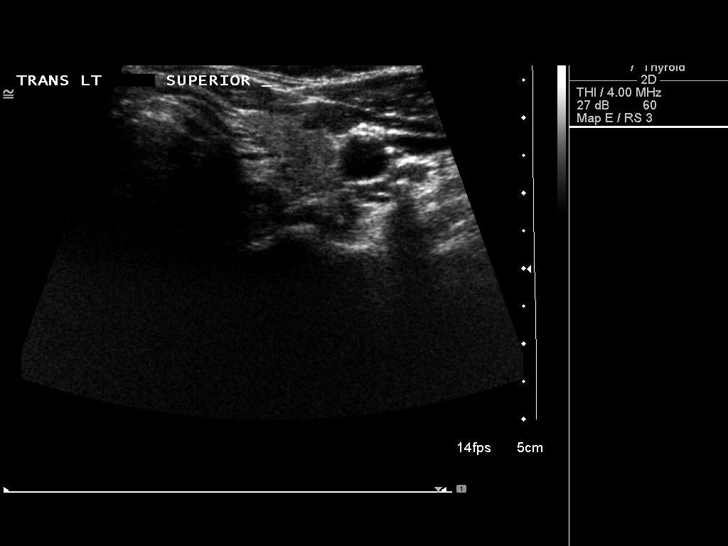
[im 34/37]
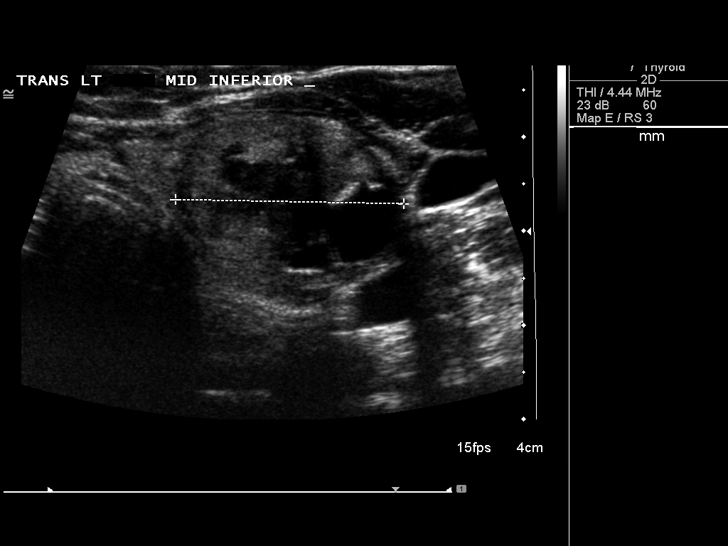
[im 37/37]
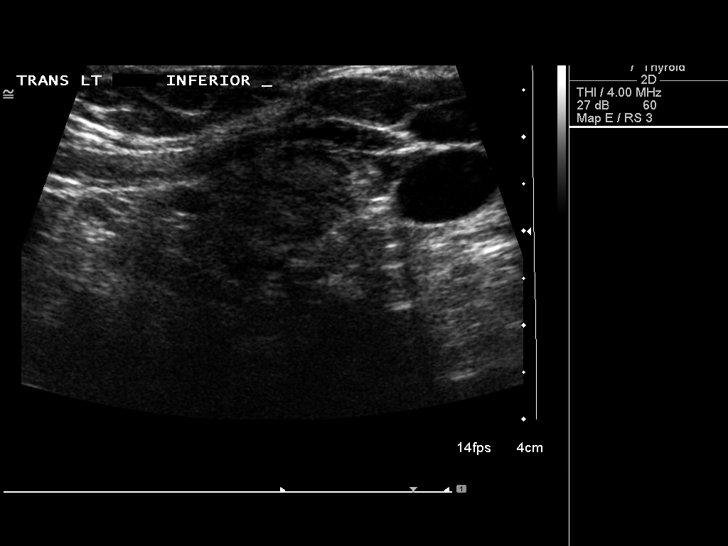

[14 of 25 positions shown; findings below may reference images not displayed]

FINDINGS: Right thyroid lobe:  5.9 x 1.1 x 1.2 cm.
Left thyroid lobe:  5.3 x 2.3 x 2.4 cm.
Isthmus:  1 mm.

Focal nodules:  The solitary left lower pole thyroid nodule
measures 3.4 x 2.0 x 2.4 cm.  It remains heterogeneous with
punctate hyperdense foci.  It is stable.  No new nodule.

Lymphadenopathy:  None visualized.
IMPRESSION: Stable large solitary left thyroid nodule.

## 2015-03-16 ENCOUNTER — Ambulatory Visit (INDEPENDENT_AMBULATORY_CARE_PROVIDER_SITE_OTHER): Payer: BLUE CROSS/BLUE SHIELD | Admitting: Gynecology

## 2015-03-16 ENCOUNTER — Encounter: Payer: Self-pay | Admitting: Gynecology

## 2015-03-16 VITALS — BP 126/82 | Ht 60.5 in | Wt 133.0 lb

## 2015-03-16 DIAGNOSIS — E041 Nontoxic single thyroid nodule: Secondary | ICD-10-CM

## 2015-03-16 DIAGNOSIS — Z01419 Encounter for gynecological examination (general) (routine) without abnormal findings: Secondary | ICD-10-CM

## 2015-03-16 DIAGNOSIS — E038 Other specified hypothyroidism: Secondary | ICD-10-CM | POA: Diagnosis not present

## 2015-03-16 DIAGNOSIS — E785 Hyperlipidemia, unspecified: Secondary | ICD-10-CM | POA: Diagnosis not present

## 2015-03-16 DIAGNOSIS — M2550 Pain in unspecified joint: Secondary | ICD-10-CM | POA: Diagnosis not present

## 2015-03-16 LAB — THYROID PANEL WITH TSH
Free Thyroxine Index: 2.8 (ref 1.4–3.8)
T3 Uptake: 30 % (ref 22–35)
T4, Total: 9.4 ug/dL (ref 4.5–12.0)
TSH: 1.393 u[IU]/mL (ref 0.350–4.500)

## 2015-03-16 LAB — CBC WITH DIFFERENTIAL/PLATELET
Basophils Absolute: 0.1 10*3/uL (ref 0.0–0.1)
Basophils Relative: 1 % (ref 0–1)
Eosinophils Absolute: 0.2 10*3/uL (ref 0.0–0.7)
Eosinophils Relative: 3 % (ref 0–5)
HCT: 43 % (ref 36.0–46.0)
HEMOGLOBIN: 14.6 g/dL (ref 12.0–15.0)
LYMPHS ABS: 2 10*3/uL (ref 0.7–4.0)
Lymphocytes Relative: 32 % (ref 12–46)
MCH: 30.7 pg (ref 26.0–34.0)
MCHC: 34 g/dL (ref 30.0–36.0)
MCV: 90.3 fL (ref 78.0–100.0)
MPV: 9.8 fL (ref 8.6–12.4)
Monocytes Absolute: 0.4 10*3/uL (ref 0.1–1.0)
Monocytes Relative: 7 % (ref 3–12)
NEUTROS ABS: 3.5 10*3/uL (ref 1.7–7.7)
NEUTROS PCT: 57 % (ref 43–77)
Platelets: 242 10*3/uL (ref 150–400)
RBC: 4.76 MIL/uL (ref 3.87–5.11)
RDW: 13.2 % (ref 11.5–15.5)
WBC: 6.2 10*3/uL (ref 4.0–10.5)

## 2015-03-16 LAB — COMPREHENSIVE METABOLIC PANEL
ALT: 15 U/L (ref 6–29)
AST: 20 U/L (ref 10–35)
Albumin: 4.1 g/dL (ref 3.6–5.1)
Alkaline Phosphatase: 78 U/L (ref 33–115)
BUN: 13 mg/dL (ref 7–25)
CO2: 24 mmol/L (ref 20–31)
Calcium: 9.2 mg/dL (ref 8.6–10.2)
Chloride: 102 mmol/L (ref 98–110)
Creat: 0.67 mg/dL (ref 0.50–1.10)
GLUCOSE: 86 mg/dL (ref 65–99)
Potassium: 3.7 mmol/L (ref 3.5–5.3)
SODIUM: 137 mmol/L (ref 135–146)
Total Bilirubin: 0.4 mg/dL (ref 0.2–1.2)
Total Protein: 7.3 g/dL (ref 6.1–8.1)

## 2015-03-16 LAB — LIPID PANEL
Cholesterol: 195 mg/dL (ref 125–200)
HDL: 47 mg/dL (ref >=46)
LDL Cholesterol: 115 mg/dL (ref <130)
Total CHOL/HDL Ratio: 4.1 Ratio (ref <=5.0)
Triglycerides: 165 mg/dL — ABNORMAL HIGH (ref <150)
VLDL: 33 mg/dL — AB (ref <30)

## 2015-03-16 LAB — RHEUMATOID FACTOR: Rhuematoid fact SerPl-aCnc: <10 IU/mL (ref <=14)

## 2015-03-16 MED ORDER — IBUPROFEN 800 MG PO TABS
800.0000 mg | ORAL_TABLET | Freq: Three times a day (TID) | ORAL | Status: DC | PRN
Start: 2015-03-16 — End: 2017-02-22

## 2015-03-16 NOTE — Progress Notes (Addendum)
Tina Huffman [birth year] [id number]   History:    44 y.o.  for annual gyn exam who was complaining of sinus allergy like symptoms. Patient with past history of hyperlipidemia had been on Crestor 10 mg 1 by mouth daily for a year and discontinued secondary to myalgias and elevated liver function test. See previous note 03/17/2014. Her lipid profile was found to be in the normal range. But her SGOT and SGPT were elevated. Patient complains after year being off medication with joint discomfort at times. She denied any photosensitivity.  Patient has been followed byDr. Darnell Level general surgeon who has been monitoring the patient for left thyroid nodule. She has hypothyroidism and is on Synthroid 50 mcg daily. An ultrasound was ordered by him earlier this year once again demonstrated the stable large left thyroid nodule. She is using condoms for contraception. Cycles are reported to be normal. Patient with no prior history of abnormal Pap smears. Patient with past history of gestational diabetes.  Patient has informed me that her father in his 26s was diagnosed with colon polyp which were benign  Past medical history,surgical history, family history and social history were all reviewed and documented in the EPIC chart.  Gynecologic History Patient's last menstrual period was 02/25/2015. Contraception: condoms Last Pap: 2015. Results were: normal Last mammogram: 2016. Results were: normal  Obstetric History OB History  Gravida Para Term Preterm AB SAB TAB Ectopic Multiple Living  # Outcome Date GA Lbr Len/2nd Weight Sex Delivery Anes PTL Lv  1 Term     F Vag-Spont  N Y       ROS: A ROS was performed and pertinent positives and negatives are included in the history.  GENERAL: No fevers or chills. HEENT: No change in vision, no earache, sore throat or sinus congestion. NECK: No pain or stiffness. CARDIOVASCULAR: No chest pain or pressure. No palpitations. PULMONARY: No  shortness of breath, cough or wheeze. GASTROINTESTINAL: No abdominal pain, nausea, vomiting or diarrhea, melena or bright red blood per rectum. GENITOURINARY: No urinary frequency, urgency, hesitancy or dysuria. MUSCULOSKELETAL: No joint or muscle pain, no back pain, no recent trauma. DERMATOLOGIC: No rash, no itching, no lesions. ENDOCRINE: No polyuria, polydipsia, no heat or cold intolerance. No recent change in weight. HEMATOLOGICAL: No anemia or easy bruising or bleeding. NEUROLOGIC: No headache, seizures, numbness, tingling or weakness. PSYCHIATRIC: No depression, no loss of interest in normal activity or change in sleep pattern.     Exam: chaperone present  BP 126/82 mmHg  Ht 5' 0.5" (1.537 m)  Wt 133 lb (60.328 kg)  BMI 25.54 kg/m2  LMP 02/25/2015  Body mass index is 25.54 kg/(m^2).  General appearance : Well developed well nourished female. No acute distress HEENT: Left inferior parathyroid with nodule  Lungs: Clear to auscultation, no rhonchi or wheezes, or rib retractions  Heart: Regular rate and rhythm, no murmurs or gallops Breast:Examined in sitting and supine position were symmetrical in appearance, no palpable masses or tenderness,  no skin retraction, no nipple inversion, no nipple discharge, no skin discoloration, no axillary or supraclavicular lymphadenopathy Abdomen: no palpable masses or tenderness, no rebound or guarding Extremities: no edema or skin discoloration or tenderness  Pelvic:  Bartholin, Urethra, Skene Glands: Within normal limits             Vagina: No gross lesions or discharge  Cervix: No gross lesions or discharge  Uterus  anteverted,  normal size, shape and consistency, non-tender and mobile  Adnexa  Without masses or tenderness  Anus and perineum  normal   Rectovaginal  normal sphincter tone without palpated masses or tenderness             Hemoccult not indicated     Assessment/Plan:  44 y.o. female for annual exam with stable thyroid nodules  being followed by Dr. Gerrit Friends general surgeon ultrasound this year. Patient with hypothyroidism currently on Synthroid 50 g daily. We'll do a thyroid panel today. The following screening labs were also ordered today: Comprehensive metabolic panel, fasting lipid profile, CBC and urinalysis. Because of her joint discomfort we'll going to check a rheumatoid factor as well as test for lupus with a any. Patient reports normal menstrual cycles. She'll be referred to the allergist for further evaluation of her allergies. She was reminded do her monthly breast exam. We discussed importance of calcium vitamin D and regular exercise for osteoporosis prevention.   Ok Edwards MD, 9:23 AM 03/16/2015

## 2015-03-17 LAB — URINALYSIS W MICROSCOPIC + REFLEX CULTURE
BILIRUBIN URINE: NEGATIVE
Bacteria, UA: NONE SEEN [HPF]
CASTS: NONE SEEN [LPF]
CRYSTALS: NONE SEEN [HPF]
GLUCOSE, UA: NEGATIVE
Hgb urine dipstick: NEGATIVE
Ketones, ur: NEGATIVE
Leukocytes, UA: NEGATIVE
Nitrite: NEGATIVE
PROTEIN: NEGATIVE
Specific Gravity, Urine: 1.012 (ref 1.001–1.035)
Yeast: NONE SEEN [HPF]
pH: 7.5 (ref 5.0–8.0)

## 2015-03-17 LAB — ANA: Anti Nuclear Antibody(ANA): NEGATIVE

## 2015-03-20 LAB — URINE CULTURE: Colony Count: 30000

## 2015-03-21 ENCOUNTER — Other Ambulatory Visit: Payer: Self-pay | Admitting: Gynecology

## 2015-03-21 MED ORDER — NITROFURANTOIN MONOHYD MACRO 100 MG PO CAPS: 100.0000 mg | ORAL_CAPSULE | Freq: Two times a day (BID) | ORAL | Status: DC

## 2015-09-08 ENCOUNTER — Encounter: Payer: Self-pay | Admitting: Gynecology

## 2015-09-26 ENCOUNTER — Ambulatory Visit (INDEPENDENT_AMBULATORY_CARE_PROVIDER_SITE_OTHER): Payer: BLUE CROSS/BLUE SHIELD | Admitting: Pediatrics

## 2015-09-26 ENCOUNTER — Encounter: Payer: Self-pay | Admitting: Pediatrics

## 2015-09-26 VITALS — BP 120/76 | HR 76 | Temp 98.2°F | Resp 16 | Ht 59.84 in | Wt 133.6 lb

## 2015-09-26 DIAGNOSIS — H1010 Acute atopic conjunctivitis, unspecified eye: Secondary | ICD-10-CM | POA: Insufficient documentation

## 2015-09-26 DIAGNOSIS — J301 Allergic rhinitis due to pollen: Secondary | ICD-10-CM | POA: Insufficient documentation

## 2015-09-26 DIAGNOSIS — E042 Nontoxic multinodular goiter: Secondary | ICD-10-CM | POA: Diagnosis not present

## 2015-09-26 DIAGNOSIS — H1045 Other chronic allergic conjunctivitis: Secondary | ICD-10-CM

## 2015-09-26 MED ORDER — FLUTICASONE PROPIONATE 50 MCG/ACT NA SUSP: 2.0000 | Freq: Every day | NASAL | Status: DC

## 2015-09-26 NOTE — Patient Instructions (Signed)
Environmental control of dust mite Claritin 10 mg once a day for runny nose or itchy eyes Fluticasone 2 sprays per nostril once a day for stuffy nose Keep the dog out of her bedroom Zaditor 0.025% one drop 3 times a day to prevent allergies Add  prednisone 10 mg twice a day for 4 days, 10 mg on the fifth day to bring her allergic symptoms t under control

## 2015-09-26 NOTE — Progress Notes (Signed)
[address] [place] [address] Dept: [phone]  New Patient Note  Patient ID: Tina Huffman, female    DOB: 1970-11-02  Age: 45 y.o. MRN: [id number] Date of Office Visit: 09/26/2015 Referring provider: No referring provider defined for this encounter.    Chief Complaint: Nasal Congestion  HPI Tristin Castellanos presents for evaluation of nasal congestion for several years. Zyrtec helps her but it makes her nasal passages feel too dry. Claritin does help her. Her symptoms are perennial. She has aggravation of her symptoms on exposure to dust, and  pollen. She has not had eczema or asthma. Referred by Dr. Reynaldo Minium  Review of Systems  Constitutional: Negative.   HENT:       Nasal congestion for several years. Perennial symptoms  Eyes: Negative.   Respiratory:       Negative except for one episode of pneumonia 2 years ago  Cardiovascular: Negative.   Gastrointestinal: Negative.   Genitourinary: Negative.   Skin: Negative.   Neurological: Negative.   Endo/Heme/Allergies:       Hypothyroidism with a history of thyroid nodules. Gestational diabetes which cleared up  Psychiatric/Behavioral: Negative.     Outpatient Encounter Prescriptions as of 09/26/2015  Medication Sig  . calcium carbonate (OS-CAL) 600 MG TABS tablet Take 600 mg by mouth 2 (two) times daily with a meal.  . fish oil-omega-3 fatty acids 1000 MG capsule Take 2 g by mouth daily.    Marland Kitchen ibuprofen (ADVIL,MOTRIN) 800 MG tablet Take 1 tablet (800 mg total) by mouth every 8 (eight) hours as needed.  Marland Kitchen levothyroxine (SYNTHROID, LEVOTHROID) 50 MCG tablet Take 1 tablet (50 mcg total) by mouth daily.  . Multiple Vitamin (MULTIVITAMIN) capsule Take 1 capsule by mouth daily.   Marland Kitchen thiamine (VITAMIN B-1) 50 MG tablet Take 50 mg by mouth daily.  . fluticasone (FLONASE) 50 MCG/ACT nasal spray Place 2 sprays into both nostrils daily. for stuffy nose.  . nitrofurantoin, macrocrystal-monohydrate, (MACROBID) 100 MG capsule  Take 1 capsule (100 mg total) by mouth 2 (two) times daily. (Patient not taking: Reported on 09/26/2015)   No facility-administered encounter medications on file as of 09/26/2015.     Drug Allergies:  Allergies  Allergen Reactions  . Tessalon [Benzonatate]     dizziness    Family History: Audryna's family history includes Cancer in her maternal grandmother; Hyperlipidemia in her father; Thyroid disease in her mother. There is no history of Allergic rhinitis, Angioedema, Asthma, Eczema, Immunodeficiency, or Urticaria..  Social and environmental. There is a [place] in the home. She has never smoked and is not exposed to cigarette smoking. 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  Physical Exam: BP 120/76 mmHg  Pulse 76  Temp(Src) 98.2 F (36.8 C) (Oral)  Resp 16  Ht 4' 11.84" (1.52 m)  Wt 133 lb 9.6 oz (60.6 kg)  BMI 26.23 kg/m2   Physical Exam  Constitutional: She is oriented to person, place, and time. She appears well-developed and well-nourished.  HENT:  Eyes normal. Ears normal. Nose moderate swelling of nasal turbinates with clear nasal discharge. Pharynx normal.  Neck: Neck supple.  She had a small thyroid nodule in the left lobe of her thyroid gland  Cardiovascular:  S1 and S2 normal no murmurs  Pulmonary/Chest:  Clear to percussion and auscultation  Abdominal: Soft. There is no tenderness (no hepatosplenomegaly).  Lymphadenopathy:    She has no cervical adenopathy.  Neurological: She is alert and oriented to person, place, and time.  Skin:  Clear  Psychiatric: She has a normal mood and affect. Her behavior is normal. Judgment and thought content normal.  Vitals reviewed.   Diagnostics:  Allergy skin tests were positive to grass pollens, weeds, tree pollens, dust mites, cat and dog  Assessment Assessment and Plan: 1. Allergic rhinitis due to pollen   2. Seasonal allergic conjunctivitis   3. Multiple thyroid nodules     Meds ordered this encounter    Medications  . fluticasone (FLONASE) 50 MCG/ACT nasal spray    Sig: Place 2 sprays into both nostrils daily. for stuffy nose.    Dispense:  16 g    Refill:  5    Patient Instructions  Environmental control of dust mite Claritin 10 mg once a day for runny nose or itchy eyes Fluticasone 2 sprays per nostril once a day for stuffy nose Keep the dog out of her bedroom Zaditor 0.025% one drop 3 times a day to prevent allergies Add  prednisone 10 mg twice a day for 4 days, 10 mg on the fifth day to bring her allergic symptoms t under control    Return in about 4 weeks (around 10/24/2015).   Thank you for the opportunity to care for this patient.  Please do not hesitate to contact me with questions.  Tonette Bihari, M.D.  [facility] [address] [place], [place] [address] [phone]

## 2015-10-24 ENCOUNTER — Ambulatory Visit (INDEPENDENT_AMBULATORY_CARE_PROVIDER_SITE_OTHER): Payer: BLUE CROSS/BLUE SHIELD | Admitting: Pediatrics

## 2015-10-24 ENCOUNTER — Encounter: Payer: Self-pay | Admitting: Pediatrics

## 2015-10-24 VITALS — BP 116/74 | HR 76 | Temp 98.4°F | Resp 16

## 2015-10-24 DIAGNOSIS — H1045 Other chronic allergic conjunctivitis: Secondary | ICD-10-CM

## 2015-10-24 DIAGNOSIS — J301 Allergic rhinitis due to pollen: Secondary | ICD-10-CM | POA: Diagnosis not present

## 2015-10-24 DIAGNOSIS — H1010 Acute atopic conjunctivitis, unspecified eye: Secondary | ICD-10-CM

## 2015-10-24 NOTE — Progress Notes (Signed)
  [address] [place] [address] Dept: [phone]  FOLLOW UP NOTE  Patient ID: Tina GoonLorena Bobeck, female    DOB: 05-10-1971  Age: 45 y.o. MRN: [id number] Date of Office Visit: 10/24/2015  Assessment Chief Complaint: Nasal Congestion and Eyes  HPI Delesia Brumbach presents for follow-up of allergic rhinitis and conjunctivitis. Her symptoms have been much improved since her last visit .She is on fluticasone 2 sprays per nostril once a day. She is not using Claritin on a daily basis.. She had a dramatic response to  prednisone for 5 days after her last visit.  Current medications Zaditor 0.025% one drop 3 times a day, fluticasone 2 sprays per nostril once a day and Claritin 10 mg once a day if needed for runny nose or itchy eyes.   Drug Allergies:  Allergies  Allergen Reactions  . Tessalon [Benzonatate]     dizziness    Physical Exam: BP 116/74 mmHg  Pulse 76  Temp(Src) 98.4 F (36.9 C) (Oral)  Resp 16   Physical Exam  Constitutional: She is oriented to person, place, and time. She appears well-developed and well-nourished.  HENT:  Eyes showed mild erythema of the palpebral conjunctiva. Ears normal. Nose mild swelling of nasal turbinates. Pharynx normal.  Neck: Neck supple.  Cardiovascular:  S1 and S2 normal no murmurs  Pulmonary/Chest:  Clear to percussion and auscultation  Lymphadenopathy:    She has no cervical adenopathy.  Neurological: She is alert and oriented to person, place, and time.  Psychiatric: She has a normal mood and affect. Her behavior is normal. Judgment and thought content normal.  Vitals reviewed.   Diagnostics:  none  Assessment and Plan: 1. Allergic rhinitis due to pollen   2. Seasonal allergic conjunctivitis     .     Patient Instructions  Continue on the present treatment plan but she may need to take Claritin 10 mg once a day on a daily basis until mid June, if the grass pollen and tree pollens bother her a great deal. Call me  if you're not doing well on this treatment plan    Return in about 6 months (around 04/25/2016).    Thank you for the opportunity to care for this patient.  Please do not hesitate to contact me with questions.  Tonette BihariJ. A. Kimbria Camposano, M.D.  [facility] [address] [place], [place] [address] [phone]

## 2015-10-24 NOTE — Patient Instructions (Signed)
Continue on the present treatment plan but she may need to take Claritin 10 mg once a day on a daily basis until mid June, if the grass pollen and tree pollens bother her a great deal. Call me if you're not doing well on this treatment plan

## 2016-02-23 IMAGING — US US SOFT TISSUE HEAD/NECK
1 series · 14 of 25 positions shown · non-contrast
Comparison: Prior thyroid ultrasound 12/25/2012

CLINICAL DATA: 43-year-old female with left thyroid nodule

EXAM:
THYROID ULTRASOUND
TECHNIQUE: Ultrasound examination of the thyroid gland and adjacent soft
tissues was performed.

[Series 1: us soft tissue head/neck · 0.08mm/px · 14 of 43 slices shown]
[im 1/43]
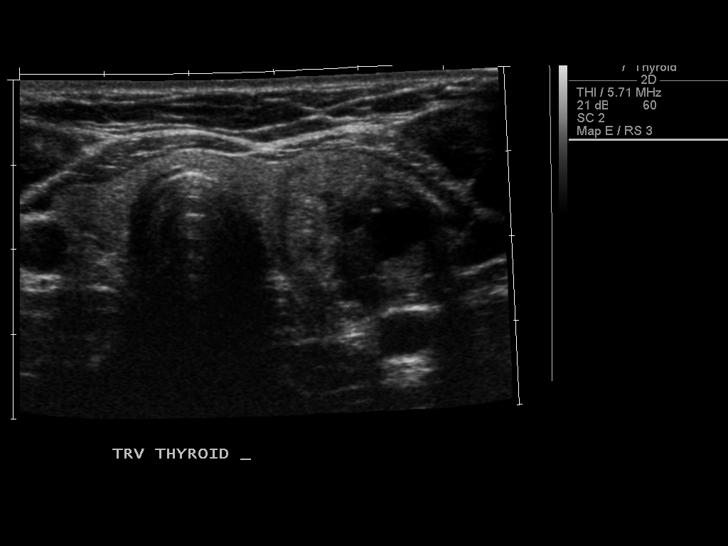
[im 4/43]
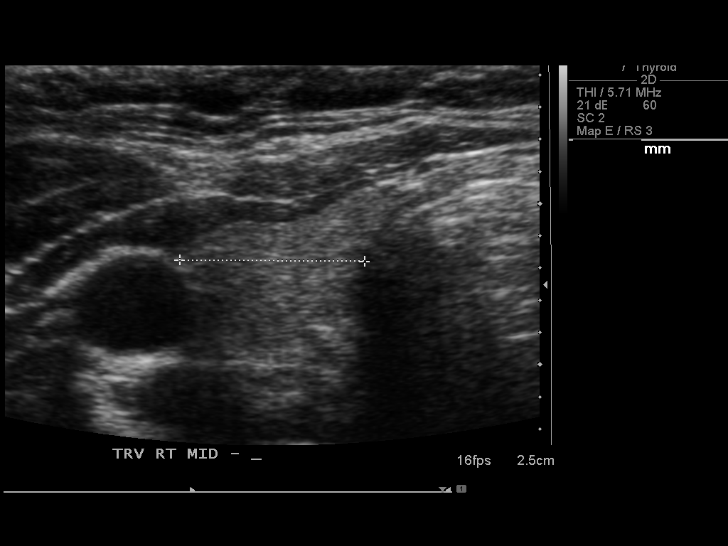
[im 8/43]
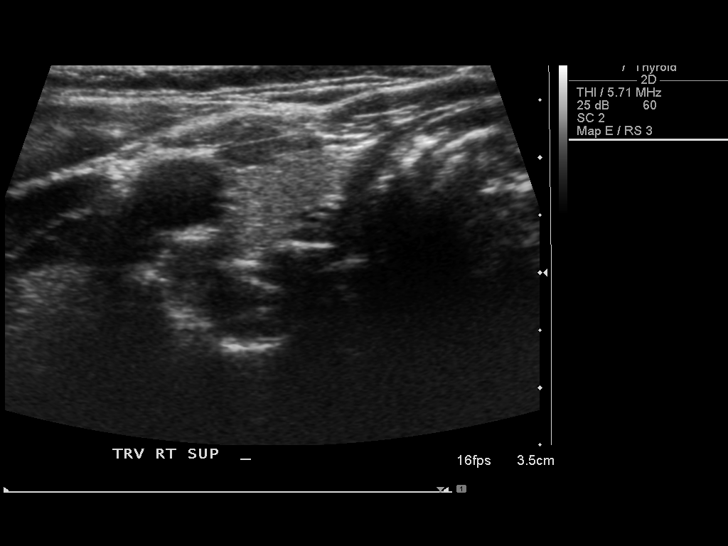
[im 11/43]
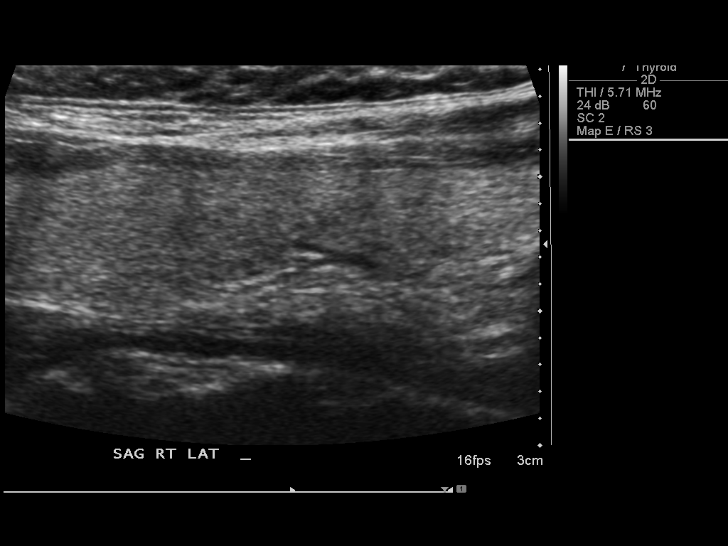
[im 15/43]
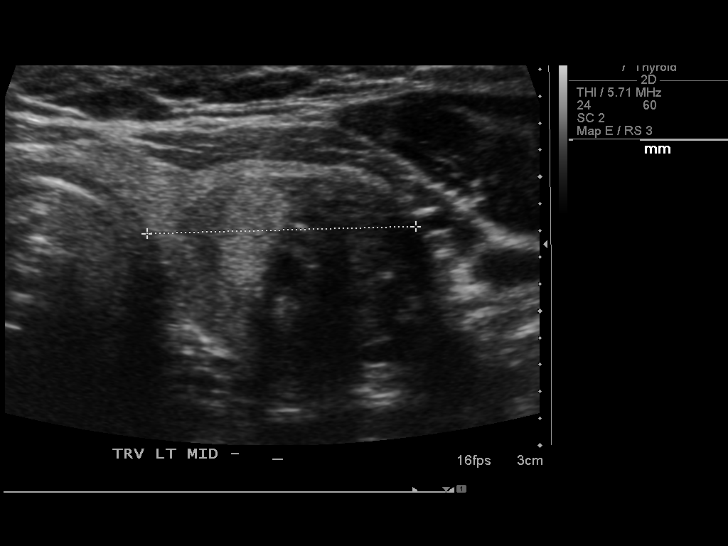
[im 16/43]
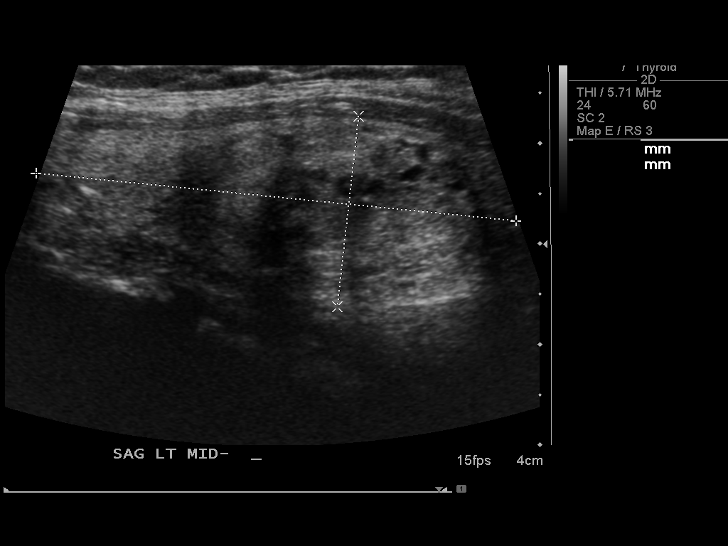
[im 20/43]
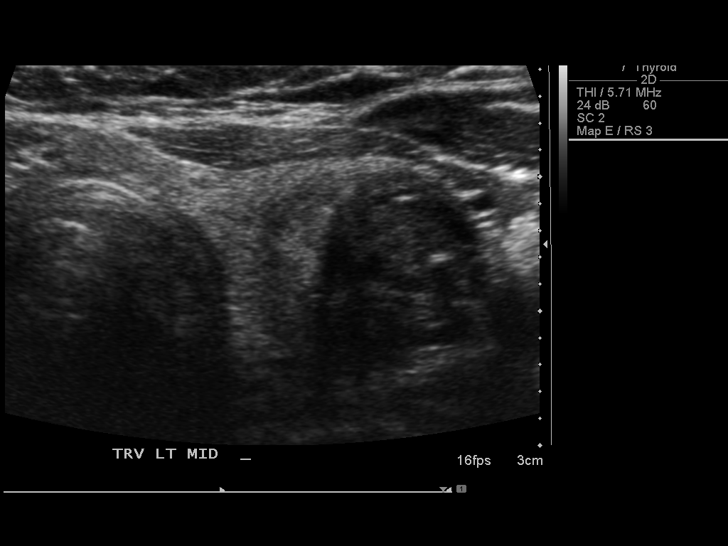
[im 23/43]
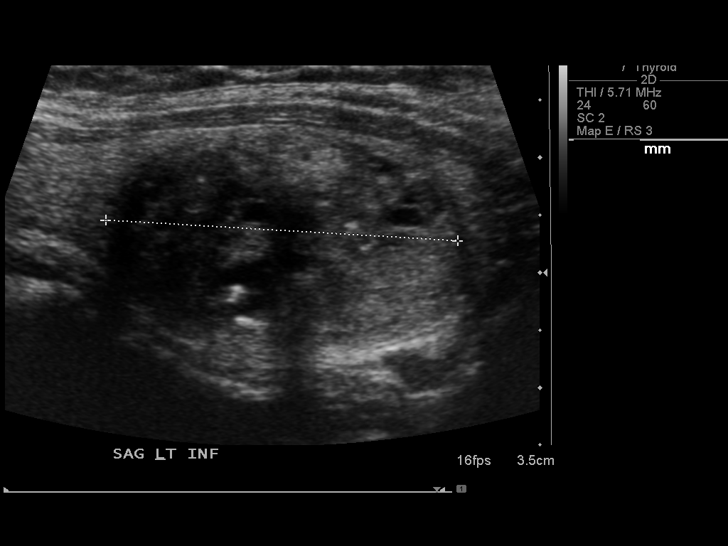
[im 27/43]
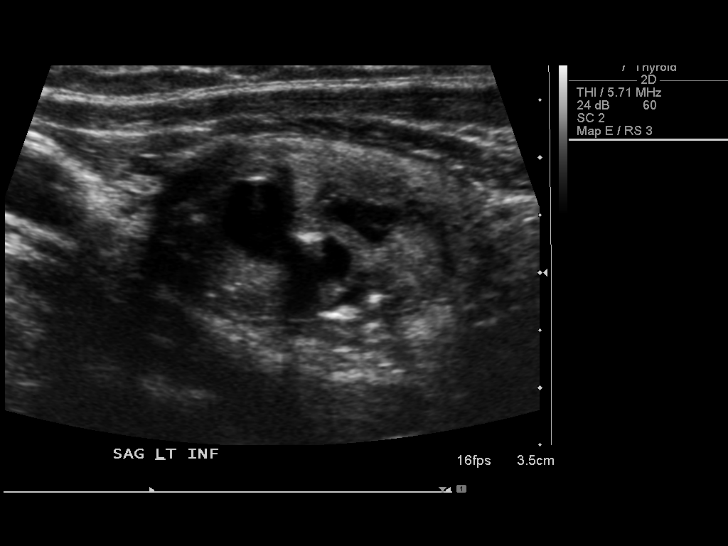
[im 29/43]
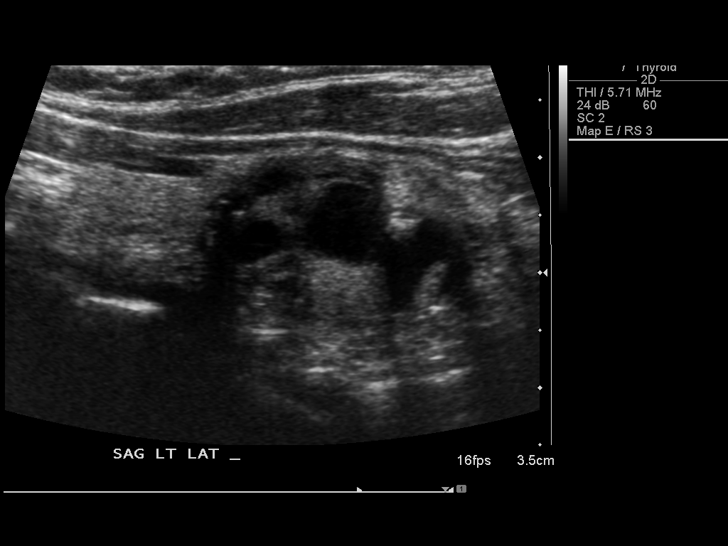
[im 32/43]
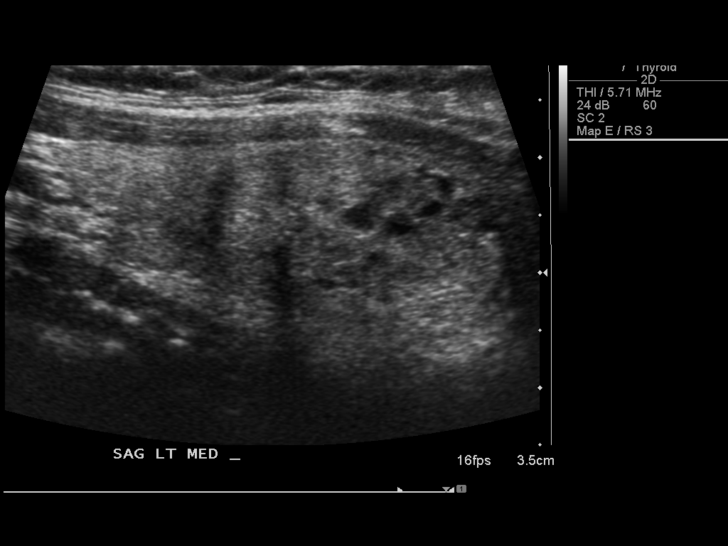
[im 36/43]
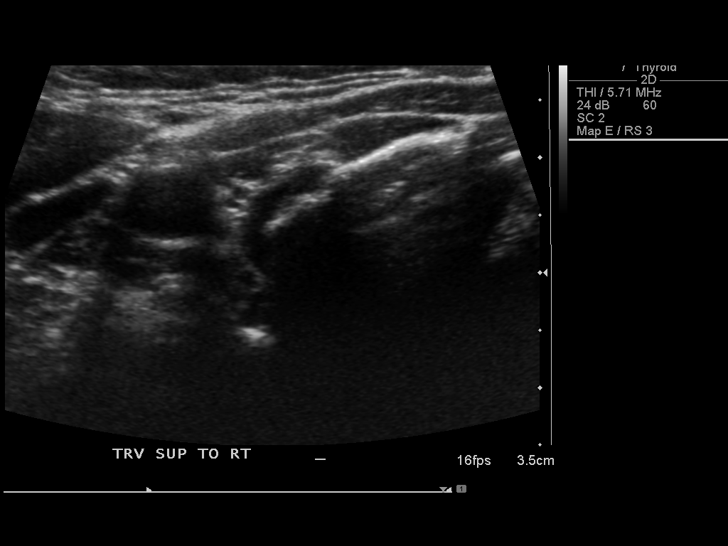
[im 39/43]
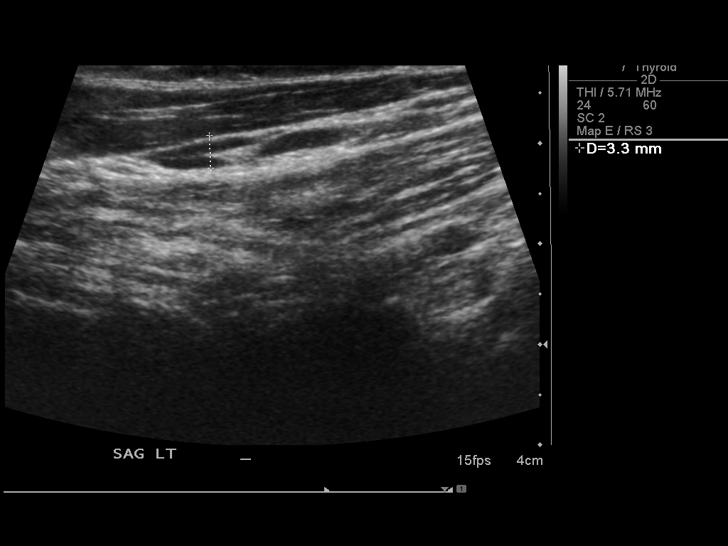
[im 43/43]
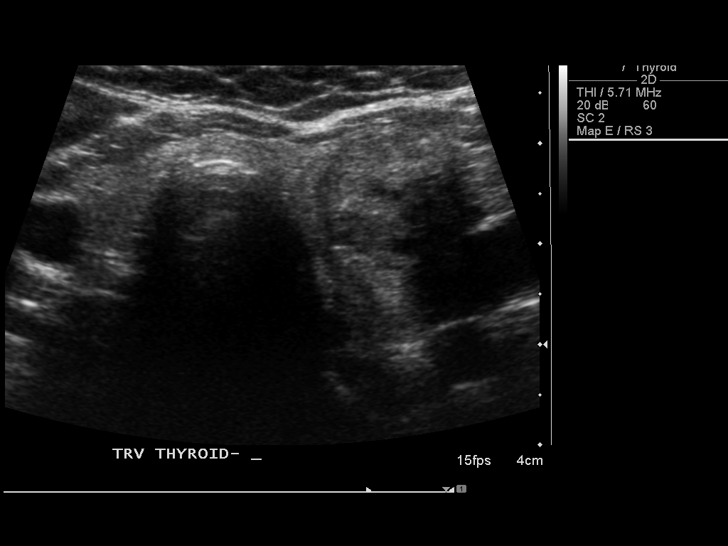

[14 of 25 positions shown; findings below may reference images not displayed]

FINDINGS: Right thyroid lobe

Measurements: 5.3 x 1.1 x 1.2 cm.  No nodules visualized.

Left thyroid lobe

Measurements: 4.8 x 1.9 x 2.0 cm. Dominant ovoid heterogeneous mixed
cystic and solid nodule with internal colloid artifact versus
microcalcifications measures 3.1 x 1.8 x 1.9 cm compared to 3.4 x
2.0 x 2.4 cm.

Isthmus

Thickness: 0.2 cm.  No nodules visualized.

Lymphadenopathy

None visualized.
IMPRESSION: Slight interval decrease in size of the dominant and a previously
biopsied nodule in the left thyroid gland compared to 12/25/2012.

## 2016-03-22 ENCOUNTER — Encounter: Payer: BLUE CROSS/BLUE SHIELD | Admitting: Gynecology

## 2016-03-26 ENCOUNTER — Encounter: Payer: BLUE CROSS/BLUE SHIELD | Admitting: Gynecology

## 2016-03-28 ENCOUNTER — Ambulatory Visit (INDEPENDENT_AMBULATORY_CARE_PROVIDER_SITE_OTHER): Payer: BLUE CROSS/BLUE SHIELD | Admitting: Gynecology

## 2016-03-28 ENCOUNTER — Encounter: Payer: Self-pay | Admitting: Gynecology

## 2016-03-28 VITALS — BP 124/78 | Ht 60.0 in | Wt 134.0 lb

## 2016-03-28 DIAGNOSIS — M25579 Pain in unspecified ankle and joints of unspecified foot: Secondary | ICD-10-CM

## 2016-03-28 DIAGNOSIS — N912 Amenorrhea, unspecified: Secondary | ICD-10-CM | POA: Diagnosis not present

## 2016-03-28 DIAGNOSIS — E038 Other specified hypothyroidism: Secondary | ICD-10-CM | POA: Diagnosis not present

## 2016-03-28 DIAGNOSIS — Z01419 Encounter for gynecological examination (general) (routine) without abnormal findings: Secondary | ICD-10-CM | POA: Diagnosis not present

## 2016-03-28 LAB — LIPID PANEL
Cholesterol: 184 mg/dL (ref 125–200)
HDL: 52 mg/dL (ref >=46)
LDL CALC: 99 mg/dL (ref <130)
Total CHOL/HDL Ratio: 3.5 Ratio (ref <=5.0)
Triglycerides: 164 mg/dL — ABNORMAL HIGH (ref <150)
VLDL: 33 mg/dL — ABNORMAL HIGH (ref <30)

## 2016-03-28 LAB — TSH: TSH: 1.24 m[IU]/L

## 2016-03-28 LAB — COMPREHENSIVE METABOLIC PANEL
ALT: 18 U/L (ref 6–29)
AST: 22 U/L (ref 10–35)
Albumin: 4.2 g/dL (ref 3.6–5.1)
Alkaline Phosphatase: 80 U/L (ref 33–115)
BILIRUBIN TOTAL: 0.3 mg/dL (ref 0.2–1.2)
BUN: 12 mg/dL (ref 7–25)
CO2: 25 mmol/L (ref 20–31)
Calcium: 9.5 mg/dL (ref 8.6–10.2)
Chloride: 102 mmol/L (ref 98–110)
Creat: 0.57 mg/dL (ref 0.50–1.10)
GLUCOSE: 85 mg/dL (ref 65–99)
Potassium: 3.8 mmol/L (ref 3.5–5.3)
SODIUM: 136 mmol/L (ref 135–146)
Total Protein: 7.4 g/dL (ref 6.1–8.1)

## 2016-03-28 LAB — CBC WITH DIFFERENTIAL/PLATELET
BASOS PCT: 1 %
Basophils Absolute: 54 cells/uL (ref 0–200)
EOS PCT: 4 %
Eosinophils Absolute: 216 cells/uL (ref 15–500)
HEMATOCRIT: 42.5 % (ref 35.0–45.0)
Hemoglobin: 14.2 g/dL (ref 11.7–15.5)
LYMPHS PCT: 35 %
Lymphs Abs: 1890 cells/uL (ref 850–3900)
MCH: 30.7 pg (ref 27.0–33.0)
MCHC: 33.4 g/dL (ref 32.0–36.0)
MCV: 91.8 fL (ref 80.0–100.0)
MONO ABS: 378 {cells}/uL (ref 200–950)
MONOS PCT: 7 %
MPV: 10.4 fL (ref 7.5–12.5)
Neutro Abs: 2862 cells/uL (ref 1500–7800)
Neutrophils Relative %: 53 %
PLATELETS: 244 10*3/uL (ref 140–400)
RBC: 4.63 MIL/uL (ref 3.80–5.10)
RDW: 13.3 % (ref 11.0–15.0)
WBC: 5.4 10*3/uL (ref 3.8–10.8)

## 2016-03-28 LAB — PREGNANCY, URINE: PREG TEST UR: NEGATIVE

## 2016-03-28 NOTE — Progress Notes (Signed)
Era BumpersLorena Huffman Sep 24, 1970 [id number]   History:    45 y.o.  for annual gyn exam who states that she has not had a menstrual period since June 7 of this year. She denies any nausea, vomiting, any visual disturbances or any unusual headache. She does have history of hypothyroidism and had been followed by the general surgeon Dr. Reece AgarG as a result of a thyroid nodule on the left that he has been monitoring. Patient also had some joint discomfort in the past that she had a normal rheumatoid factor and ANA. Patient also in the past and had history of hyperlipidemia for which she had been on Crestor 10 mg daily but discontinued back in 2015 because of myalgias and elevated liver function test.   Past medical history,surgical history, family history and social history were all reviewed and documented in the EPIC chart.  Gynecologic History Patient's last menstrual period was 01/18/2016. Contraception: condoms Last Pa2012 2015 Results were: normal Last mammogra2017 Results were: normal  Obstetric History OB History  Gravida Para Term Preterm AB Living  1 1 1     1   SAB TAB Ectopic Multiple Live Births          1    # Outcome Date GA Lbr Len/2nd Weight Sex Delivery Anes PTL Lv  1 Term     F Vag-Spont  N LIV       ROS: A ROS was performed and pertinent positives and negatives are included in the history.  GENERAL: No fevers or chills. HEENT: No change in vision, no earache, sore throat or sinus congestion. NECK: No pain or stiffness. CARDIOVASCULAR: No chest pain or pressure. No palpitations. PULMONARY: No shortness of breath, cough or wheeze. GASTROINTESTINAL: No abdominal pain, nausea, vomiting or diarrhea, melena or bright red blood per rectum. GENITOURINARY: No urinary frequency, urgency, hesitancy or dysuria. MUSCULOSKELETAL: No joint or muscle pain, no back pain, no recent trauma. DERMATOLOGIC: No rash, no itching, no lesions. ENDOCRINE: No polyuria, polydipsia, no heat or cold intolerance. No  recent change in weight. HEMATOLOGICAL: No anemia or easy bruising or bleeding. NEUROLOGIC: No headache, seizures, numbness, tingling or weakness. PSYCHIATRIC: No depression, no loss of interest in normal activity or change in sleep pattern.     Exam: chaperone present  BP 124/78   Ht 5' (1.524 m)   Wt 134 lb (60.8 kg)   LMP 01/18/2016   BMI 26.17 kg/m   Body mass index is 26.17 kg/m.  General appearance : Well developed well nourished female. No acute distress HEENT: Eyes: no retinal hemorrhage or exudates,  Neck supple, trachea midline, no carotid bruits, no thyroidmegaly Lungs: Clear to auscultation, no rhonchi or wheezes, or rib retractions  Heart: Regular rate and rhythm, no murmurs or gallops Breast:Examined in sitting and supine position were symmetrical in appearance, no palpable masses or tenderness,  no skin retraction, no nipple inversion, no nipple discharge, no skin discoloration, no axillary or supraclavicular lymphadenopathy Abdomen: no palpable masses or tenderness, no rebound or guarding Extremities: no edema or skin discoloration or tenderness  Pelvic:  Bartholin, Urethra, Skene Glands: Within normal limits             Vagina: No gross lesions or discharge  Cervix: No gross lesions or discharge  Uteruanteverted, normal size, shape and consistency, non-tender and mobile  Adnexa  Without masses or tenderness  Anus and perineum  normal   Rectovaginal  normal sphincter tone without palpated masses or tenderness  Hemoccult Not indicated   urine pregnancy test negative     assessment/plan: 45 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 She will be prescribed Provera 10 mg to take 1 by mouth daily for 10 days to jump start her menses if the following blood tests are normal today: Serum prolactin, FSH and TSH. The remainder of her screening blood work will be drawn today as well consisting of CBC, comprehensive metabolic panel, lipid  profile and urinalysis. Patient will continue on levothyroxine 50 g daily. She was reminded to follow-up with the general surgeon this year. Patient will be given refill for Provera 10 mg to take every 30 days if she does not have a spontaneous menses and if her home pregnancy test is negative. She would take it for 10 days of the month only she does not have a spontaneous menses. Instructions were provided in Spanish. A vitamin D level will be obtained because of her muscle tiredness and fatigue and past history of vitamin D deficiency. An additional 15 minutes was spent discussing rheumatoid arthritis versus osteoarthritis as well as hypothyroidism and her history of thyroid nodule and amenorrhea causes and treatment.     Ok EdwardsFERNANDEZ,JUAN H MD, 1:09 PM 03/28/2016

## 2016-03-28 NOTE — Patient Instructions (Signed)
Osteoartritis (Osteoarthritis) La osteoartritis es una enfermedad que provoca dolor e inflamacin en las articulaciones. Ocurre cuando el cartlago de la articulacin afectada se desgasta. El cartlago acta como una almohadilla que cubre los extremos de los huesos que forman una articulacin. La osteoartritis es la ms frecuente de reumatismo articular. Afecta a menudo a los ancianos. Las articulaciones que se ven ms afectadas por esta afeccin son las que se encuentran en las siguientes zonas:  Los extremos de los dedos.  Los pulgares.  El cuello.  La parte inferior de la espalda.  Las rodillas.  Las caderas CAUSAS  Con el paso del [place], el cartlago que recubre los extremos de los huesos comienza a IT sales professional. Esto provoca friccin [employer], lo que causa dolor y entumecimiento en las articulaciones afectadas.  [place] probabilidades de padecer osteoartritis, incluidos los siguientes:  Edad avanzada.  Exceso de Engineer, site.  Uso excesivo de la articulacin.  Lesin previa en la articulacin. [place] y entumecimiento en la articulacin.  Con el tiempo, la articulacin pierde su forma normal.  Pueden formarse pequeos depsitos de hueso (ostefitos) en los extremos de Water engineer.  Algunos trozos de [employer] o cartlago pueden separarse y flotar dentro del espacio de la articulacin. Esto puede causar ms dolor y lesiones. DIAGNSTICO  El mdico le preguntar acerca de sus sntomas y le har un examen fsico. Le indicarn varios estudios, como:  Radiografas de Counselling psychologist.  Anlisis de sangre para descartar otros tipos de artritis. Pueden usarse pruebas adicionales para diagnosticar la enfermedad. TRATAMIENTO  Los [employer] del tratamiento son Financial controller y mejorar el funcionamiento de Water engineer. Los planes de tratamiento pueden incluir lo siguiente:  Un  programa de ejercicios recomendado que permita el descanso y el alivio de la articulacin.  Un plan de control del peso.  Tcnicas de [employer], como las siguientes:  Aplicacin correcta de fro y Freight forwarder.  Impulsos elctricos enviados a las terminaciones nerviosas que se encuentran debajo de la piel (neuroestimulacin elctrica transcutnea [TENS]).  Masajes.  Ciertos suplementos nutricionales.  Medicamentos para Financial controller como:  Paracetamol.  Antiinflamatorios no esteroides (AINE), como el naproxeno.  Narcticos o agentes de accin central, como el tramadol.  Corticoides. Estos se pueden administrar por va oral o mediante una inyeccin.  [place] para reposicionar los [employer] y Best boy (osteotoma) o para retirar las piezas sueltas de hueso y Database administrator. Puede ser necesario el reemplazo de las articulaciones en estadios avanzados de la enfermedad. [place] los medicamentos solamente como se lo haya indicado el mdico.  Mantenga un peso saludable. Siga las instrucciones del mdico con respecto al control del [place]. Esto puede incluir instrucciones Recruitment consultant.  Practique los ejercicios que le indiquen. Es posible que el mdico le recomiende tipos especficos de ejercicios. Estos pueden incluir los siguientes:  Ejercicios de fortalecimiento Se realizan para fortalecer los msculos que sostienen las articulaciones afectadas por la artritis. Pueden realizarse con peso o con bandas para agregar resistencia.  Actividades Precious Haws. Son Clinical research associate a paso ligero, gimnasia [place] de bajo impacto, que acelere el corazn.  Actividades de amplitud de movimientos. Dan agilidad a las articulaciones.  Ejercicios de equilibrio y [place]. Ayudan a [employer] Devices se necesitan para la vida diaria.  Haga descansar a las articulaciones segn las indicaciones del mdico.  Concurra a todas las  visitas de  control como se lo haya indicado el mdico. SOLICITE ATENCIN MDICA SI:   La piel se pone roja.  Aparece una erupcin adems del dolor en la articulacin.  El dolor en la articulacin empeora.  Tiene fiebre y siente dolor en la articulacin o el msculo. SOLICITE ATENCIN MDICA DE INMEDIATO SI:  Nota una prdida importante de peso o del apetito.  Tiene transpiracin nocturna. PARA Parthenia AmesBTENER MS INFORMACIN   The Krogernstituto Nacional de Artritis y Event organisernfermedades Musculoesquelticas y Dermatolgicas [facility](National Institute of Arthritis and Musculoskeletal and Skin Diseases): www.niams.http://www.myers.net/nih.gov.  Instituto Lockheed Martinacional sobre el Envejecimiento (General Millsational Institute on Aging): https://walker.com/www.nia.nih.gov.  Instituto Norteamericano de Advice workereumatologa (American College of Rheumatology): www.rheumatology.org.   Esta informacin no tiene como fin reemplazar el consejo del mdico. Asegrese de hacerle al mdico cualquier pregunta que tenga.   Document Released: 05/02/2005 Document Revised: 08/13/2014 Elsevier Interactive Patient Education 2016 Arvin[employer]itorElsevier Inc. Perimenopausia (Perimenopause) La perimenopausia es el momento en que su cuerpo comienza a pasar a la menopausia (sin menstruacin durante 12 meses consecutivos). Es un proceso natural. La perimenopausia puede comenzar entre 2 y 8 aos antes de la menopausia y por lo general tiene una duracin de 1 ao ms pasada la menopausia. Starbucks CorporationDurante este tiempo, los ovarios podran producir un vulo o no. Los ovarios varan su produccin de las hormonas estrgeno y Psychiatric nurseprogesterona cada mes. Esto puede causar perodos menstruales irregulares, dificultad para quedar embarazada, hemorragia vaginal entre perodos y sntomas incmodos. CAUSAS  Produccin irregular de las hormonas ovricas estrgeno y Education officer, museumprogesterona, y no ovular todos los meses.  Otras causas son:  Tumor de la glndula pituitaria.  Enfermedades que Ameren Corporationafectan los ovarios.  Radioterapia.  Quimioterapia.  Causas  desconocidas.  Fumar mucho y abusar del consumo de alcohol puede llevar a que la perimenopausia aparezca antes. SIGNOS Y SNTOMAS   Acaloramiento.  Sudoracin nocturna.  Perodos menstruales irregulares.  Disminucin del deseo sexual.  Sequedad vaginal.  Dolores de cabeza.  Cambios en el estado de nimo.  Depresin.  Problemas de memoria.  Irritabilidad.  Cansancio.  Aumento de [place]o.  Problemas para quedar embarazada.  Prdida de clulas seas (osteoporosis).  Comienzo de endurecimiento de las arterias (aterosclerosis). DIAGNSTICO  El mdico realizar un diagnstico en funcin de su edad, historial de perodos menstruales y sntomas. Le realizarn un examen fsico para ver si hay algn cambio en su cuerpo, en especial en sus rganos reproductores. Las pruebas hormonales pueden ser o no tiles segn la cantidad de hormonas femeninas que produzca y Hartford Financialcundo las produzca. Sin embargo, podrn Tree surgeonrealizarse otras pruebas hormonales para Music therapistdetectar otros problemas. TRATAMIENTO  En algunos casos, no se necesita tratamiento. La decisin acerca de qu tratamiento es necesario durante la perimenopausia deber realizarse en conjunto con su mdico segn cmo estn afectando los sntomas a su estilo de vida. Existen varios tratamientos disponibles, como:  Warehouse managerTratar cada sntoma individual con medicamentos especficos para ese sntoma.  Algunos medicamentos herbales pueden ayudar en sntomas especficos.  Psicoterapia.  Terapia grupal. INSTRUCCIONES PARA EL CUIDADO EN EL HOGAR   Controle sus periodos menstruales (cundo ocurren, qu tan abundantes son, cunto tiempo pasa entre perodos, y cunto duran) como tambin sus sntomas y cundo comenzaron.  Tome slo medicamentos de venta libre o recetados, segn las indicaciones del mdico.  Duerma y descanse.  Haga actividad fsica.  Consuma una dieta que contenga calcio (bueno para los [place]) y productos derivados de la soja (actan como  estrgenos).  No fume.  Evite las bebidas alcohlicas.  Tome los suplementos vitamnicos segn  las indicaciones del mdico. En ciertos casos, puede ser de [place] Grenadinesayuda tomar vitamina E.  Tome suplementos de calcio y vitamina D para ayudar a Research scientist (medical)prevenir la prdida sea.  En algunos casos la terapia de grupo podr ayudarla.  La acupuntura puede ser de ayuda en ciertos casos. SOLICITE ATENCIN MDICA SI:   Tiene preguntas acerca de sus sntomas.  Necesita ser derivada a un especialista (gineclogo, psiquiatra, o psiclogo). SOLICITE ATENCIN MDICA DE INMEDIATO SI:   Sufre una hemorragia vaginal abundante.  Su perodo menstrual dura ms de 8 das.  Sus perodos son recurrentes cada menos de [address] das.  Tiene hemorragias durante las The St. Paul Travelersrelaciones sexuales.  Est muy deprimido.  Siente dolor al ConocoPhillipsorinar.  Siente dolor de cabeza intenso.  Tiene problemas de visin.   Esta informacin no tiene Theme park managercomo fin reemplazar el consejo del mdico. Asegrese de hacerle al mdico cualquier pregunta que tenga.   Document Released: 07/23/2005 Document Revised: 05/13/2013 Elsevier Interactive Patient Education Yahoo[employer] Inc2016 Elsevier Inc.

## 2016-03-29 ENCOUNTER — Encounter: Payer: Self-pay | Admitting: Gynecology

## 2016-03-29 LAB — VITAMIN D 25 HYDROXY (VIT D DEFICIENCY, FRACTURES): VIT D 25 HYDROXY: 21 ng/mL — AB (ref 30–100)

## 2016-03-29 LAB — URINALYSIS W MICROSCOPIC + REFLEX CULTURE
BACTERIA UA: NONE SEEN [HPF]
Bilirubin Urine: NEGATIVE
Casts: NONE SEEN [LPF]
Crystals: NONE SEEN [HPF]
GLUCOSE, UA: NEGATIVE
HGB URINE DIPSTICK: NEGATIVE
Ketones, ur: NEGATIVE
LEUKOCYTES UA: NEGATIVE
Nitrite: NEGATIVE
PROTEIN: NEGATIVE
Specific Gravity, Urine: 1.017 (ref 1.001–1.035)
YEAST: NONE SEEN [HPF]
pH: 8 (ref 5.0–8.0)

## 2016-03-29 LAB — FOLLICLE STIMULATING HORMONE: FSH: 8.2 m[IU]/mL

## 2016-03-29 LAB — PROLACTIN: PROLACTIN: 10.1 ng/mL

## 2016-03-30 ENCOUNTER — Other Ambulatory Visit: Payer: Self-pay | Admitting: Anesthesiology

## 2016-03-30 ENCOUNTER — Other Ambulatory Visit: Payer: Self-pay | Admitting: Gynecology

## 2016-03-30 DIAGNOSIS — E781 Pure hyperglyceridemia: Secondary | ICD-10-CM

## 2016-03-30 DIAGNOSIS — E559 Vitamin D deficiency, unspecified: Secondary | ICD-10-CM

## 2016-03-30 LAB — URINE CULTURE: Organism ID, Bacteria: NO GROWTH

## 2016-03-30 MED ORDER — VITAMIN D (ERGOCALCIFEROL) 1.25 MG (50000 UNIT) PO CAPS: 50000.0000 [IU] | ORAL_CAPSULE | ORAL | 0 refills | Status: DC

## 2016-03-30 NOTE — Progress Notes (Signed)
Please call in prescription

## 2016-04-27 ENCOUNTER — Other Ambulatory Visit: Payer: Self-pay | Admitting: Surgery

## 2016-04-27 DIAGNOSIS — E041 Nontoxic single thyroid nodule: Secondary | ICD-10-CM

## 2016-05-11 ENCOUNTER — Ambulatory Visit
Admission: RE | Admit: 2016-05-11 | Discharge: 2016-05-11 | Disposition: A | Discharge status: Home / Self Care | Payer: BLUE CROSS/BLUE SHIELD | Source: Ambulatory Visit | Attending: Surgery | Admitting: Surgery

## 2016-05-11 DIAGNOSIS — E041 Nontoxic single thyroid nodule: Secondary | ICD-10-CM

## 2016-07-06 ENCOUNTER — Other Ambulatory Visit: Payer: BLUE CROSS/BLUE SHIELD

## 2016-07-06 ENCOUNTER — Other Ambulatory Visit: Payer: Self-pay | Admitting: Gynecology

## 2016-07-06 DIAGNOSIS — E559 Vitamin D deficiency, unspecified: Secondary | ICD-10-CM

## 2016-07-06 DIAGNOSIS — E781 Pure hyperglyceridemia: Secondary | ICD-10-CM

## 2016-07-06 LAB — LIPID PANEL
CHOLESTEROL: 183 mg/dL (ref <200)
HDL: 46 mg/dL — AB (ref >50)
LDL CALC: 105 mg/dL — AB (ref <100)
TRIGLYCERIDES: 159 mg/dL — AB (ref <150)
Total CHOL/HDL Ratio: 4 Ratio (ref <5.0)
VLDL: 32 mg/dL — ABNORMAL HIGH (ref <30)

## 2016-07-06 LAB — VITAMIN D 25 HYDROXY (VIT D DEFICIENCY, FRACTURES): Vit D, 25-Hydroxy: 28 ng/mL — ABNORMAL LOW (ref 30–100)

## 2016-07-06 MED ORDER — VITAMIN D (ERGOCALCIFEROL) 1.25 MG (50000 UNIT) PO CAPS: 50000.0000 [IU] | ORAL_CAPSULE | ORAL | 0 refills | Status: DC

## 2016-10-07 ENCOUNTER — Other Ambulatory Visit: Payer: Self-pay | Admitting: Gynecology

## 2016-10-12 ENCOUNTER — Other Ambulatory Visit: Payer: BLUE CROSS/BLUE SHIELD

## 2016-10-12 DIAGNOSIS — E559 Vitamin D deficiency, unspecified: Secondary | ICD-10-CM

## 2016-10-12 LAB — VITAMIN D 25 HYDROXY (VIT D DEFICIENCY, FRACTURES): VIT D 25 HYDROXY: 31 ng/mL (ref 30–100)

## 2016-11-02 ENCOUNTER — Encounter: Payer: Self-pay | Admitting: Gynecology

## 2016-12-11 ENCOUNTER — Telehealth: Payer: Self-pay | Admitting: *Deleted

## 2016-12-11 NOTE — Telephone Encounter (Signed)
30 tablets 4 refills

## 2016-12-11 NOTE — Telephone Encounter (Signed)
Pt called requesting refill on provera 10 mg, per note on 03/28/17.   " Provera 10 mg to take every 30 days if she does not have a spontaneous menses and if her home pregnancy test is negative. She would take it for 10 days of the month only she does not have a spontaneous menses"  How many refills should pt have? Please advise

## 2016-12-12 MED ORDER — MEDROXYPROGESTERONE ACETATE 10 MG PO TABS
ORAL_TABLET | ORAL | 4 refills | Status: DC
Start: 2016-12-12 — End: 2017-02-22

## 2016-12-12 NOTE — Telephone Encounter (Signed)
Pt informed, Rx sent for only 10 tablets as the direction are for only #10. With refills. I did tell pt to take UPT before taking Rx. She verbalized she understood.

## 2016-12-19 ENCOUNTER — Encounter: Payer: Self-pay | Admitting: Gynecology

## 2017-02-22 ENCOUNTER — Encounter: Payer: Self-pay | Admitting: Gynecology

## 2017-02-22 ENCOUNTER — Ambulatory Visit (INDEPENDENT_AMBULATORY_CARE_PROVIDER_SITE_OTHER): Payer: BLUE CROSS/BLUE SHIELD | Admitting: Gynecology

## 2017-02-22 VITALS — BP 122/80 | Ht 60.0 in | Wt 137.0 lb

## 2017-02-22 DIAGNOSIS — E038 Other specified hypothyroidism: Secondary | ICD-10-CM | POA: Diagnosis not present

## 2017-02-22 DIAGNOSIS — Z8639 Personal history of other endocrine, nutritional and metabolic disease: Secondary | ICD-10-CM

## 2017-02-22 DIAGNOSIS — E559 Vitamin D deficiency, unspecified: Secondary | ICD-10-CM | POA: Diagnosis not present

## 2017-02-22 DIAGNOSIS — N912 Amenorrhea, unspecified: Secondary | ICD-10-CM | POA: Diagnosis not present

## 2017-02-22 DIAGNOSIS — N951 Menopausal and female climacteric states: Secondary | ICD-10-CM

## 2017-02-22 DIAGNOSIS — E782 Mixed hyperlipidemia: Secondary | ICD-10-CM | POA: Diagnosis not present

## 2017-02-22 DIAGNOSIS — R232 Flushing: Secondary | ICD-10-CM

## 2017-02-22 DIAGNOSIS — Z01419 Encounter for gynecological examination (general) (routine) without abnormal findings: Secondary | ICD-10-CM

## 2017-02-22 LAB — COMPREHENSIVE METABOLIC PANEL
ALBUMIN: 4.2 g/dL (ref 3.6–5.1)
ALK PHOS: 110 U/L (ref 33–115)
ALT: 48 U/L — AB (ref 6–29)
AST: 35 U/L (ref 10–35)
BILIRUBIN TOTAL: 0.4 mg/dL (ref 0.2–1.2)
BUN: 13 mg/dL (ref 7–25)
CALCIUM: 9.5 mg/dL (ref 8.6–10.2)
CO2: 19 mmol/L — ABNORMAL LOW (ref 20–31)
Chloride: 104 mmol/L (ref 98–110)
Creat: 0.7 mg/dL (ref 0.50–1.10)
GLUCOSE: 94 mg/dL (ref 65–99)
POTASSIUM: 3.9 mmol/L (ref 3.5–5.3)
Sodium: 138 mmol/L (ref 135–146)
TOTAL PROTEIN: 7.5 g/dL (ref 6.1–8.1)

## 2017-02-22 LAB — CBC WITH DIFFERENTIAL/PLATELET
BASOS PCT: 1 %
Basophils Absolute: 51 cells/uL (ref 0–200)
EOS ABS: 204 {cells}/uL (ref 15–500)
Eosinophils Relative: 4 %
HEMATOCRIT: 43.4 % (ref 35.0–45.0)
HEMOGLOBIN: 14.4 g/dL (ref 11.7–15.5)
LYMPHS ABS: 1581 {cells}/uL (ref 850–3900)
LYMPHS PCT: 31 %
MCH: 30.3 pg (ref 27.0–33.0)
MCHC: 33.2 g/dL (ref 32.0–36.0)
MCV: 91.2 fL (ref 80.0–100.0)
MONO ABS: 408 {cells}/uL (ref 200–950)
MPV: 10.5 fL (ref 7.5–12.5)
Monocytes Relative: 8 %
Neutro Abs: 2856 cells/uL (ref 1500–7800)
Neutrophils Relative %: 56 %
Platelets: 241 10*3/uL (ref 140–400)
RBC: 4.76 MIL/uL (ref 3.80–5.10)
RDW: 13.3 % (ref 11.0–15.0)
WBC: 5.1 10*3/uL (ref 3.8–10.8)

## 2017-02-22 LAB — LIPID PANEL
CHOL/HDL RATIO: 4.6 ratio (ref <5.0)
CHOLESTEROL: 224 mg/dL — AB (ref <200)
HDL: 49 mg/dL — AB (ref >50)
LDL Cholesterol: 148 mg/dL — ABNORMAL HIGH (ref <100)
TRIGLYCERIDES: 136 mg/dL (ref <150)
VLDL: 27 mg/dL (ref <30)

## 2017-02-22 LAB — PREGNANCY, URINE: PREG TEST UR: NEGATIVE

## 2017-02-22 MED ORDER — FLUTICASONE PROPIONATE 50 MCG/ACT NA SUSP: 2.0000 | Freq: Every day | NASAL | 5 refills | Status: DC

## 2017-02-22 MED ORDER — IBUPROFEN 800 MG PO TABS
800.0000 mg | ORAL_TABLET | Freq: Three times a day (TID) | ORAL | 6 refills | Status: DC | PRN
Start: 2017-02-22 — End: 2021-04-28

## 2017-02-22 MED ORDER — MEDROXYPROGESTERONE ACETATE 10 MG PO TABS: ORAL_TABLET | ORAL | 4 refills | Status: DC

## 2017-02-22 NOTE — Progress Notes (Signed)
Tina Huffman 05/28/1971 [id number]   History:    46 y.o.  for annual gyn exam with complaint of not having a menstrual cycle since April. Patient with known history of chronic anovulation had been prescribed Provera 10 mg to take for 10 days of each month if she did not have a spontaneous menses and providing that she had done a home urine pregnancy test first. She did state that her last Provera was in April she has not had a menstrual cycle since then. She denies any nipple discharge no unusual headaches or any visual disturbances. She does have history of hypothyroidism for which she has a history of being on levothyroxine 50 g daily but also has history of thyroid nodule whereby the general surgeon Dr. Mcarthur Rossetti has been following. Patient also in the past has had history of hyperlipidemia she had been on Crestor one time but decrease it due to side effects. She will be returning for her fasting blood work. Patient also is been complaining of some hot flashes on and off. Patient with no past history of any abnormal Pap smears.  Past medical history,surgical history, family history and social history were all reviewed and documented in the EPIC chart.  Gynecologic History Patient's last menstrual period was 10/21/2016. Contraception: none Last Pap: 2015. Results were: normal Last mammogram: 2018. Results were: normal  Obstetric History OB History  Gravida Para Term Preterm AB Living  1 1 1     1   SAB TAB Ectopic Multiple Live Births          1    # Outcome Date GA Lbr Len/2nd Weight Sex Delivery Anes PTL Lv  1 Term     F Vag-Spont  N LIV       ROS: A ROS was performed and pertinent positives and negatives are included in the history.  GENERAL: No fevers or chills. HEENT: No change in vision, no earache, sore throat or sinus congestion. NECK: No pain or stiffness. CARDIOVASCULAR: No chest pain or pressure. No palpitations. PULMONARY: No shortness of breath, cough or wheeze.  GASTROINTESTINAL: No abdominal pain, nausea, vomiting or diarrhea, melena or bright red blood per rectum. GENITOURINARY: No urinary frequency, urgency, hesitancy or dysuria. MUSCULOSKELETAL: No joint or muscle pain, no back pain, no recent trauma. DERMATOLOGIC: No rash, no itching, no lesions. ENDOCRINE: No polyuria, polydipsia, no heat or cold intolerance. No recent change in weight. HEMATOLOGICAL: No anemia or easy bruising or bleeding. NEUROLOGIC: No headache, seizures, numbness, tingling or weakness. PSYCHIATRIC: No depression, no loss of interest in normal activity or change in sleep pattern.     Exam: chaperone present  BP 122/80   Ht 5' (1.524 m)   Wt 137 lb (62.1 kg)   LMP 10/21/2016   BMI 26.76 kg/m   Body mass index is 26.76 kg/m.  General appearance : Well developed well nourished female. No acute distress HEENT: Eyes: no retinal hemorrhage or exudates,  Neck supple, trachea midline, no carotid bruits, no thyroidmegaly Lungs: Clear to auscultation, no rhonchi or wheezes, or rib retractions  Heart: Regular rate and rhythm, no murmurs or gallops Breast:Examined in sitting and supine position were symmetrical in appearance, no palpable masses or tenderness,  no skin retraction, no nipple inversion, no nipple discharge, no skin discoloration, no axillary or supraclavicular lymphadenopathy Abdomen: no palpable masses or tenderness, no rebound or guarding Extremities: no edema or skin discoloration or tenderness  Pelvic:  Bartholin, Urethra, Skene Glands: Within normal limits  Vagina: No gross lesions or discharge  Cervix: No gross lesions or discharge  Uterus  anteverted, normal size, shape and consistency, non-tender and mobile  Adnexa  Without masses or tenderness  Anus and perineum  normal   Rectovaginal  normal sphincter tone without palpated masses or tenderness             Hemoccult not indicated   Urine pregnancy test today was negative.  Assessment/Plan:   46 y.o. female for annual exam with past history of chronic anovulation although she may be menopausal and for this reason which she comes next week for her fasting blood work we will check an [facility] and because of her history of thyroid nodule and hypothyroidism a thyroid panel will be obtained also. Patient is also had history vitamin D deficiency in the past we'll check a vitamin D level along with a fasting lipid profile, conference metabolic panel and CBC. If patient's FSH is normal she was provided with prescription refill for Provera to take as indicated. We discussed importance of calcium vitamin D and weightbearing exercises for osteoporosis prevention. Literature information on the menopause and on hormone replacement therapy was provided. Pap smear was done today.   Ok EdwardsFERNANDEZ,Joene Gelder H MD, 10:08 AM 02/22/2017

## 2017-02-23 LAB — THYROID PANEL WITH TSH
Free Thyroxine Index: 2.5 (ref 1.4–3.8)
T3 Uptake: 30 % (ref 22–35)
T4 TOTAL: 8.4 ug/dL (ref 4.5–12.0)
TSH: 1.25 m[IU]/L

## 2017-02-23 LAB — VITAMIN D 25 HYDROXY (VIT D DEFICIENCY, FRACTURES): VIT D 25 HYDROXY: 17 ng/mL — AB (ref 30–100)

## 2017-02-23 LAB — FOLLICLE STIMULATING HORMONE: FSH: 63.8 m[IU]/mL

## 2017-02-25 ENCOUNTER — Telehealth: Payer: Self-pay | Admitting: *Deleted

## 2017-02-25 ENCOUNTER — Other Ambulatory Visit: Payer: Self-pay | Admitting: *Deleted

## 2017-02-25 DIAGNOSIS — R945 Abnormal results of liver function studies: Secondary | ICD-10-CM

## 2017-02-25 DIAGNOSIS — R7989 Other specified abnormal findings of blood chemistry: Secondary | ICD-10-CM

## 2017-02-25 DIAGNOSIS — E559 Vitamin D deficiency, unspecified: Secondary | ICD-10-CM

## 2017-02-25 LAB — PAP IG W/ RFLX HPV ASCU

## 2017-02-25 MED ORDER — VITAMIN D (ERGOCALCIFEROL) 1.25 MG (50000 UNIT) PO CAPS: ORAL_CAPSULE | ORAL | 0 refills | Status: DC

## 2017-02-25 NOTE — Telephone Encounter (Signed)
-----   Message from Ok EdwardsJuan H Fernandez, MD sent at 02/25/2017  8:40 AM EDT ----- Victorino DikeJennifer I recently signed off on this patient's labs with recommendations to call her for nebulizer was nonspecific at dimension on the last note please let her that [facility] in the menopausal range. She does not need to take the Provera any longer. I gave her information on the menopause she does not need to be seen unless she becomes symptomatic

## 2017-02-25 NOTE — Telephone Encounter (Signed)
Pt informed

## 2017-03-05 ENCOUNTER — Other Ambulatory Visit: Payer: Self-pay | Admitting: Anesthesiology

## 2017-03-05 MED ORDER — VITAMIN D (ERGOCALCIFEROL) 1.25 MG (50000 UNIT) PO CAPS: ORAL_CAPSULE | ORAL | 0 refills | Status: DC

## 2017-03-05 MED ORDER — MEDROXYPROGESTERONE ACETATE 10 MG PO TABS: ORAL_TABLET | ORAL | 4 refills | Status: DC

## 2017-03-11 ENCOUNTER — Other Ambulatory Visit: Payer: BLUE CROSS/BLUE SHIELD

## 2017-03-12 ENCOUNTER — Other Ambulatory Visit: Payer: BLUE CROSS/BLUE SHIELD

## 2017-03-12 ENCOUNTER — Other Ambulatory Visit: Payer: Self-pay | Admitting: Anesthesiology

## 2017-03-12 DIAGNOSIS — E559 Vitamin D deficiency, unspecified: Secondary | ICD-10-CM

## 2017-03-12 DIAGNOSIS — R7989 Other specified abnormal findings of blood chemistry: Secondary | ICD-10-CM

## 2017-03-12 DIAGNOSIS — R945 Abnormal results of liver function studies: Secondary | ICD-10-CM

## 2017-03-12 LAB — ALT: ALT: 17 U/L (ref 6–29)

## 2017-03-12 LAB — AST: AST: 19 U/L (ref 10–35)

## 2017-03-12 MED ORDER — VITAMIN D (ERGOCALCIFEROL) 1.25 MG (50000 UNIT) PO CAPS: ORAL_CAPSULE | ORAL | 0 refills | Status: DC

## 2017-05-29 ENCOUNTER — Other Ambulatory Visit: Payer: Self-pay | Admitting: *Deleted

## 2017-05-29 DIAGNOSIS — E559 Vitamin D deficiency, unspecified: Secondary | ICD-10-CM

## 2017-05-31 ENCOUNTER — Other Ambulatory Visit: Payer: BLUE CROSS/BLUE SHIELD

## 2017-05-31 DIAGNOSIS — E559 Vitamin D deficiency, unspecified: Secondary | ICD-10-CM

## 2017-06-01 LAB — VITAMIN D 25 HYDROXY (VIT D DEFICIENCY, FRACTURES): VIT D 25 HYDROXY: 38 ng/mL (ref 30–100)

## 2017-08-15 IMAGING — US US THYROID
1 series · 13 of 25 positions shown · non-contrast
Comparison: 11/19/2014

CLINICAL DATA: Prior ultrasound follow-up. Follow-up thyroid
nodule. Left thyroid nodule was biopsied in 7770.

EXAM:
THYROID ULTRASOUND
TECHNIQUE: Ultrasound examination of the thyroid gland and adjacent soft
tissues was performed.

[Series 1: us thyroid · 0.08mm/px · 13 of 48 slices shown]
[im 1/48]
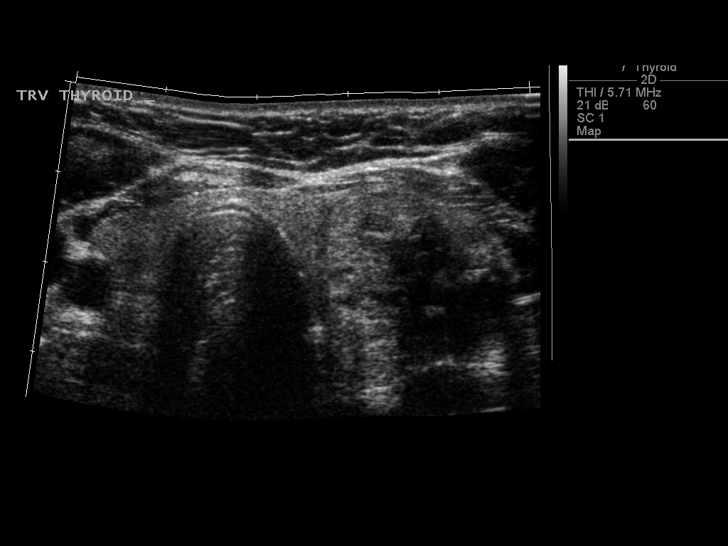
[im 4/48]
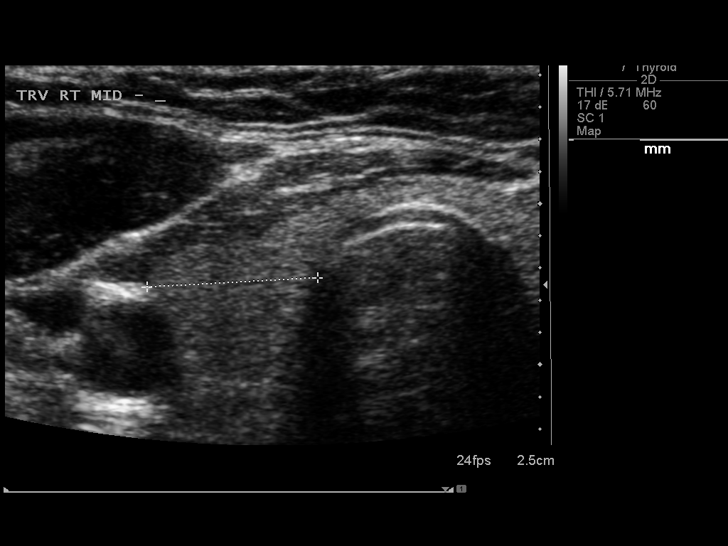
[im 8/48]
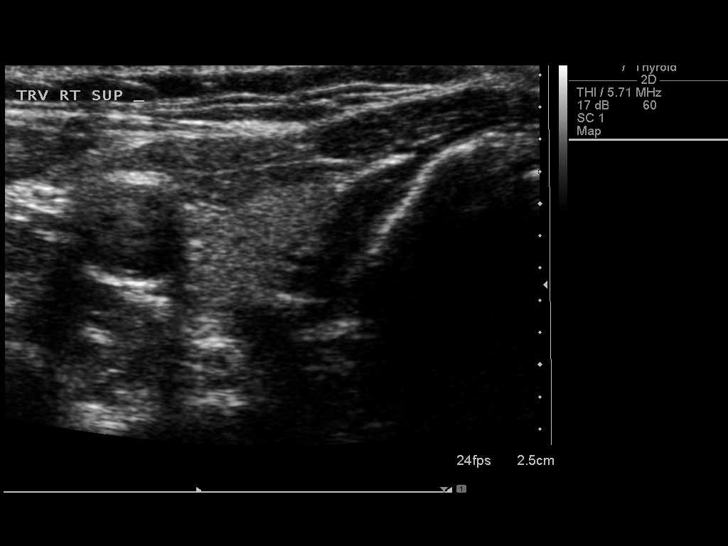
[im 12/48]
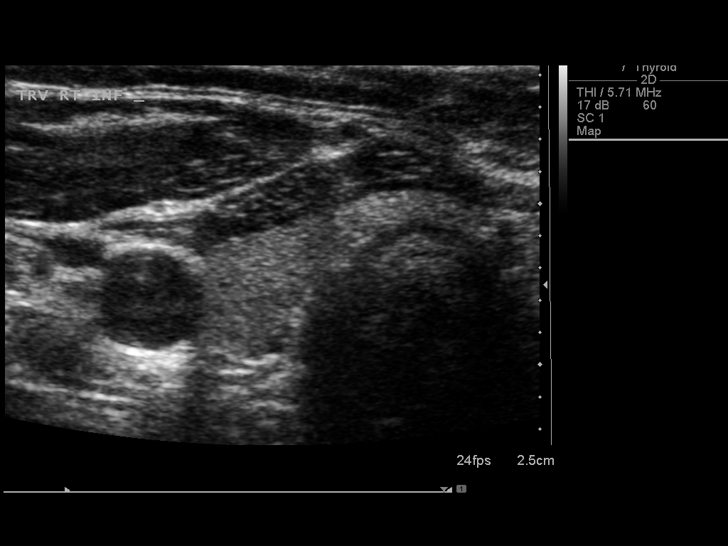
[im 16/48]
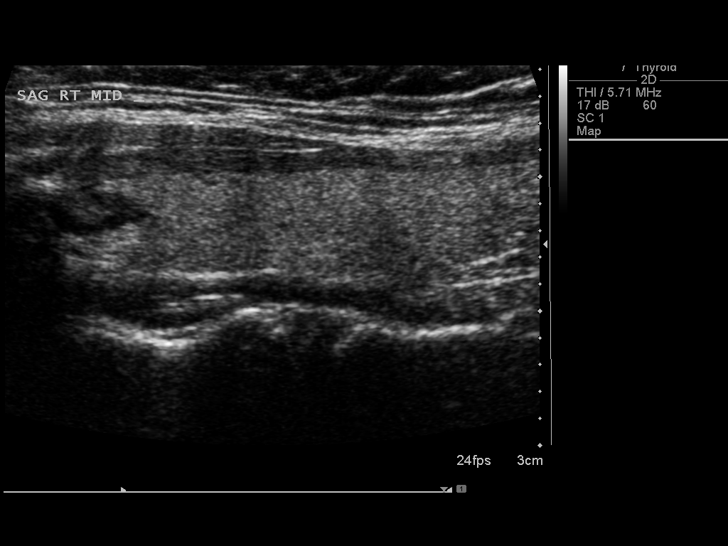
[im 20/48]
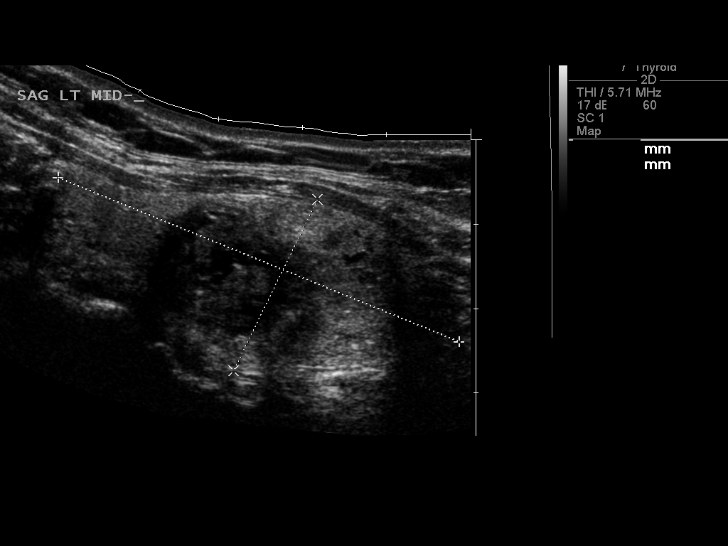
[im 24/48]
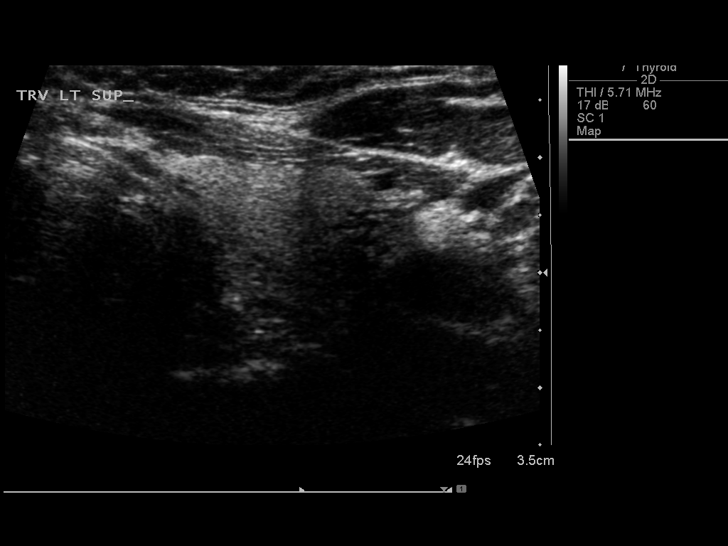
[im 28/48]
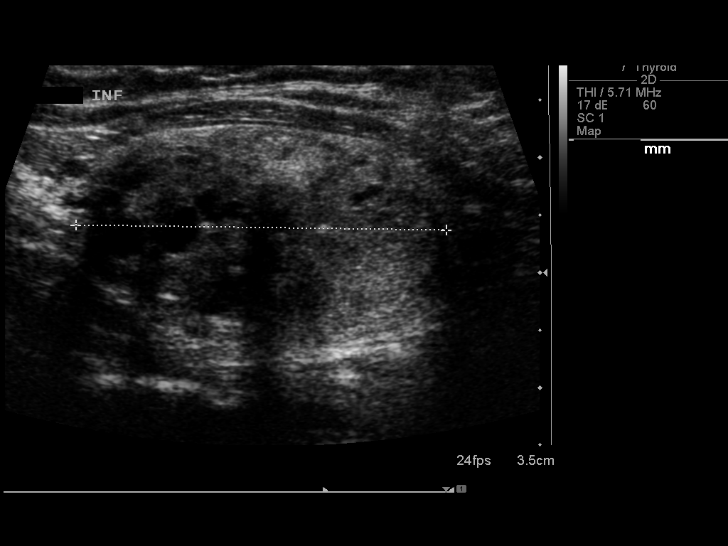
[im 32/48]
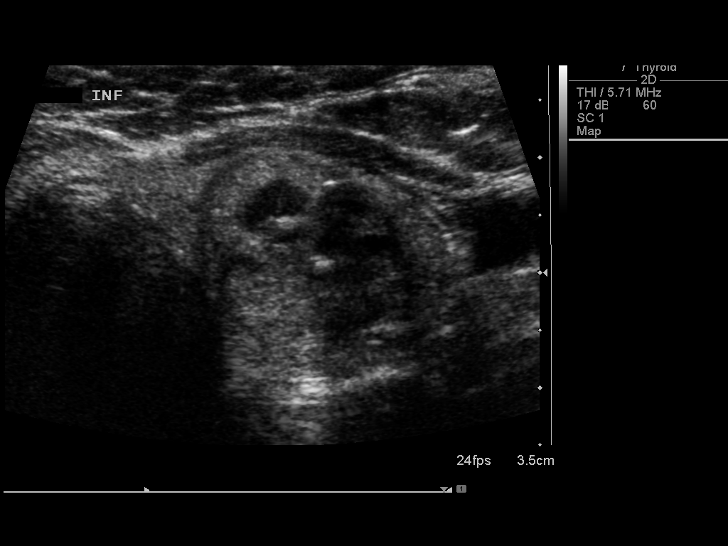
[im 36/48]
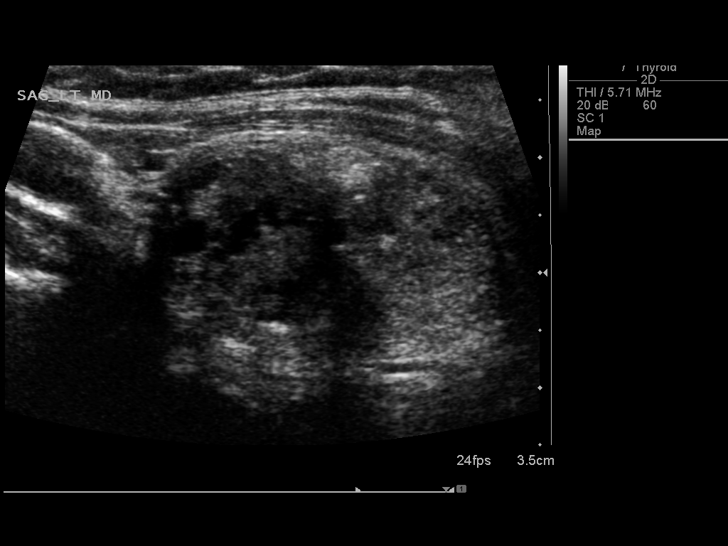
[im 40/48]
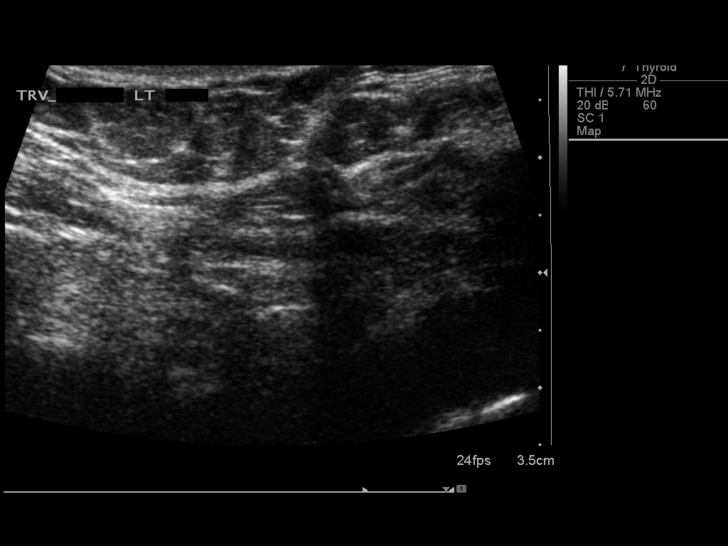
[im 44/48]
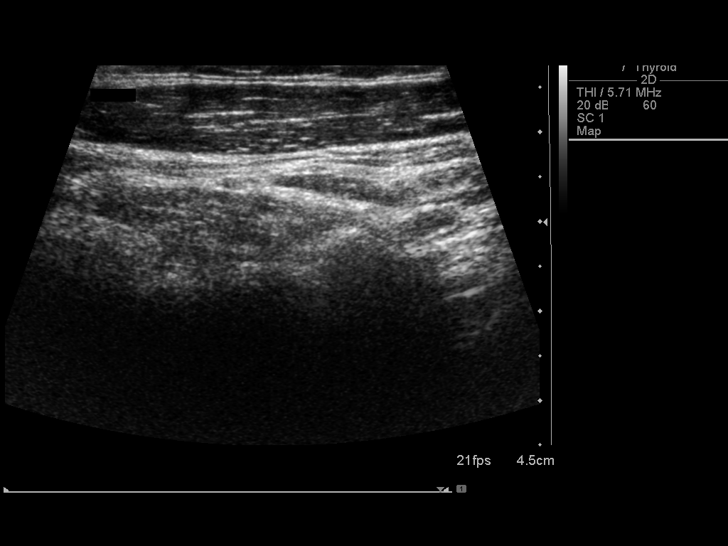
[im 48/48]
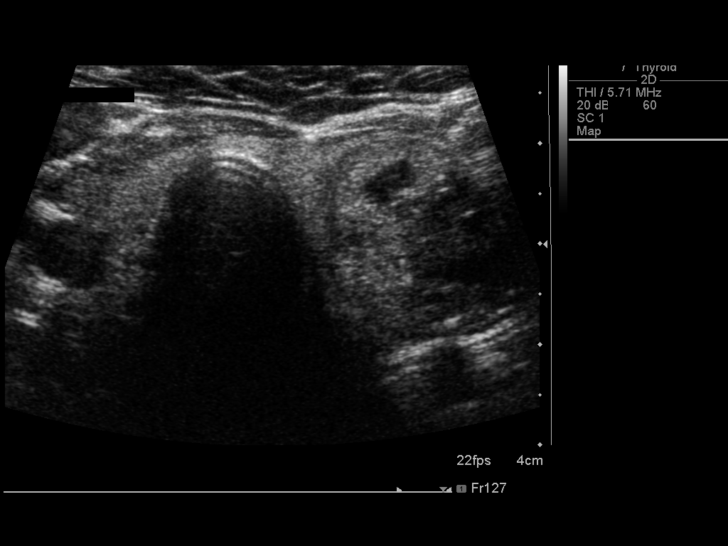

[13 of 25 positions shown; findings below may reference images not displayed]

FINDINGS: Parenchymal Echotexture: Mildly heterogenous

Estimated total number of nodules ? 1 cm: 1

Number of spongiform nodules ? 2 cm not described below (TR1): 0

Number of mixed cystic and solid nodules ? 1.5 cm not described
below (TR2): 0

_________________________________________________________

Isthmus: Measures 0.2 cm in thickness.

No discrete nodules are identified within the thyroid isthmus.

_________________________________________________________

Right lobe: Measures 5.0 x 1.0 x 1.1 cm

No discrete nodules are identified within the right lobe of the
thyroid.

_________________________________________________________

Left lobe: Measures 5.1 x 2.3 x 2.1 cm.

Again noted is a heterogeneous nodule in the inferior left thyroid
lobe that measures 3.2 x 1.8 x 2.3 cm and previously measured 3.1 x
1.9 x 2.0 cm. This is a predominantly solid nodule with scattered
small cystic components and macro calcifications. No other left
thyroid nodules.
IMPRESSION: Stable left thyroid nodule.  No new thyroid nodules.

## 2018-02-28 ENCOUNTER — Ambulatory Visit (INDEPENDENT_AMBULATORY_CARE_PROVIDER_SITE_OTHER): Payer: BLUE CROSS/BLUE SHIELD | Admitting: Obstetrics & Gynecology

## 2018-02-28 ENCOUNTER — Encounter: Payer: Self-pay | Admitting: Obstetrics & Gynecology

## 2018-02-28 VITALS — BP 126/78 | Ht 60.0 in | Wt 132.0 lb

## 2018-02-28 DIAGNOSIS — Z01419 Encounter for gynecological examination (general) (routine) without abnormal findings: Secondary | ICD-10-CM

## 2018-02-28 DIAGNOSIS — Z780 Asymptomatic menopausal state: Secondary | ICD-10-CM

## 2018-02-28 NOTE — Patient Instructions (Signed)
  1. Well female exam with routine gynecological exam Normal gynecologic exam.  Pap test negative in July 2018.  Will repeat at 2 to 3 years.  Breast exam normal.  Screening mammogram normal per patient in 2019.  Will obtain report from [place].  Fasting health labs here today.  Hypothyroidism on levothyroxine.  Body mass index 25.78.  Continue with regular physical activity and healthy nutrition. - CBC - Comp Met (CMET) - Lipid panel - TSH - VITAMIN D 25 Hydroxy (Vit-D Deficiency, Fractures)  2. Menopause present Well on no hormone replacement therapy.  No postmenopausal bleeding.  Recommend vitamin D supplements, calcium rich nutrition and regular weightbearing physical activity.  Ellis Parents, fue un placer encontrarle hoy!  Voy a informarle de sus [employer].

## 2018-02-28 NOTE — Progress Notes (Signed)
Tina Huffman 1971-03-10 [id number]   History:    47 y.o. G1P1L1 Married.  Daughter is 10 yo, doing very well.  RP:  Established patient presenting for annual gyn exam   HPI: Menopause, well without hormone replacement therapy.  Very mild occasional night sweats.  Taking natural estrogen in her diet.  No postmenopausal bleeding.  No pelvic pain.  No pain with intercourse, using lubricant.  Urine and bowel movements normal.  Body mass index 25.78.  Regular physical activities.  Fasting health labs here today.  Past medical history,surgical history, family history and social history were all reviewed and documented in the EPIC chart.  Gynecologic History Patient's last menstrual period was 05/31/2017. Contraception: post menopausal status Last Pap: 02/2017. Results were: Negative Last mammogram: 2019. Results were: normal per patient, will obtain report from [facility] Bone Density: Never Colonoscopy: Never  Obstetric History OB History  Gravida Para Term Preterm AB Living  _0 SAB TAB Ectopic Multiple Live Births          1    # Outcome Date GA Lbr Len/2nd Weight Sex Delivery Anes PTL Lv  1 Term     F Vag-Spont  N LIV     ROS: A ROS was performed and pertinent positives and negatives are included in the history.  GENERAL: No fevers or chills. HEENT: No change in vision, no earache, sore throat or sinus congestion. NECK: No pain or stiffness. CARDIOVASCULAR: No chest pain or pressure. No palpitations. PULMONARY: No shortness of breath, cough or wheeze. GASTROINTESTINAL: No abdominal pain, nausea, vomiting or diarrhea, melena or bright red blood per rectum. GENITOURINARY: No urinary frequency, urgency, hesitancy or dysuria. MUSCULOSKELETAL: No joint or muscle pain, no back pain, no recent trauma. DERMATOLOGIC: No rash, no itching, no lesions. ENDOCRINE: No polyuria, polydipsia, no heat or cold intolerance. No recent change in weight. HEMATOLOGICAL: No anemia or easy bruising or  bleeding. NEUROLOGIC: No headache, seizures, numbness, tingling or weakness. PSYCHIATRIC: No depression, no loss of interest in normal activity or change in sleep pattern.     Exam:   BP 126/78   Ht 5' (1.524 m)   Wt 132 lb (59.9 kg)   LMP 05/31/2017   BMI 25.78 kg/m   Body mass index is 25.78 kg/m.  General appearance : Well developed well nourished female. No acute distress HEENT: Eyes: no retinal hemorrhage or exudates,  Neck supple, trachea midline, no carotid bruits, no thyroidmegaly Lungs: Clear to auscultation, no rhonchi or wheezes, or rib retractions  Heart: Regular rate and rhythm, no murmurs or gallops Breast:Examined in sitting and supine position were symmetrical in appearance, no palpable masses or tenderness,  no skin retraction, no nipple inversion, no nipple discharge, no skin discoloration, no axillary or supraclavicular lymphadenopathy Abdomen: no palpable masses or tenderness, no rebound or guarding Extremities: no edema or skin discoloration or tenderness  Pelvic: Vulva: Normal             Vagina: No gross lesions or discharge  Cervix: No gross lesions or discharge  Uterus  AV, normal size, shape and consistency, non-tender and mobile  Adnexa  Without masses or tenderness  Anus: Normal   Assessment/Plan:  47 y.o. female for annual exam   1. Well female exam with routine gynecological exam Normal gynecologic exam.  Pap test negative in July 2018.  Will repeat at 2 to 3 years.  Breast exam normal.  Screening mammogram normal per patient in 2019.  Will obtain report from [facility].  Fasting health labs here today.  Hypothyroidism on levothyroxine.  Body mass index 25.78.  Continue with regular physical activity and healthy nutrition. - CBC - Comp Met (CMET) - Lipid panel - TSH - VITAMIN D 25 Hydroxy (Vit-D Deficiency, Fractures)  2. Menopause present Well on no hormone replacement therapy.  No postmenopausal bleeding.  Recommend vitamin D supplements, calcium  rich nutrition and regular weightbearing physical activity.  Princess Bruins MD, 8:54 AM 02/28/2018

## 2018-03-01 LAB — CBC
HEMATOCRIT: 43.5 % (ref 35.0–45.0)
HEMOGLOBIN: 14.7 g/dL (ref 11.7–15.5)
MCH: 30.3 pg (ref 27.0–33.0)
MCHC: 33.8 g/dL (ref 32.0–36.0)
MCV: 89.7 fL (ref 80.0–100.0)
MPV: 11 fL (ref 7.5–12.5)
Platelets: 226 10*3/uL (ref 140–400)
RBC: 4.85 10*6/uL (ref 3.80–5.10)
RDW: 12.2 % (ref 11.0–15.0)
WBC: 4.8 10*3/uL (ref 3.8–10.8)

## 2018-03-01 LAB — COMPREHENSIVE METABOLIC PANEL
AG RATIO: 1.5 (calc) (ref 1.0–2.5)
ALKALINE PHOSPHATASE (APISO): 113 U/L (ref 33–115)
ALT: 31 U/L — ABNORMAL HIGH (ref 6–29)
AST: 25 U/L (ref 10–35)
Albumin: 4.7 g/dL (ref 3.6–5.1)
BILIRUBIN TOTAL: 0.4 mg/dL (ref 0.2–1.2)
BUN: 16 mg/dL (ref 7–25)
CALCIUM: 9.9 mg/dL (ref 8.6–10.2)
CO2: 28 mmol/L (ref 20–32)
Chloride: 102 mmol/L (ref 98–110)
Creat: 0.64 mg/dL (ref 0.50–1.10)
Globulin: 3.1 g/dL (calc) (ref 1.9–3.7)
Glucose, Bld: 96 mg/dL (ref 65–99)
Potassium: 3.8 mmol/L (ref 3.5–5.3)
SODIUM: 137 mmol/L (ref 135–146)
Total Protein: 7.8 g/dL (ref 6.1–8.1)

## 2018-03-01 LAB — LIPID PANEL
CHOLESTEROL: 220 mg/dL — AB (ref <200)
HDL: 49 mg/dL — ABNORMAL LOW (ref >50)
LDL CHOLESTEROL (CALC): 133 mg/dL — AB
Non-HDL Cholesterol (Calc): 171 mg/dL (calc) — ABNORMAL HIGH (ref <130)
Total CHOL/HDL Ratio: 4.5 (calc) (ref <5.0)
Triglycerides: 237 mg/dL — ABNORMAL HIGH (ref <150)

## 2018-03-01 LAB — TSH: TSH: 0.95 m[IU]/L

## 2018-03-01 LAB — VITAMIN D 25 HYDROXY (VIT D DEFICIENCY, FRACTURES): VIT D 25 HYDROXY: 24 ng/mL — AB (ref 30–100)

## 2018-03-04 ENCOUNTER — Other Ambulatory Visit: Payer: Self-pay | Admitting: Obstetrics & Gynecology

## 2018-03-04 DIAGNOSIS — E559 Vitamin D deficiency, unspecified: Secondary | ICD-10-CM

## 2018-03-04 MED ORDER — VITAMIN D (ERGOCALCIFEROL) 1.25 MG (50000 UNIT) PO CAPS: ORAL_CAPSULE | ORAL | 0 refills | Status: DC

## 2018-06-06 ENCOUNTER — Other Ambulatory Visit: Payer: BLUE CROSS/BLUE SHIELD

## 2018-06-06 DIAGNOSIS — E559 Vitamin D deficiency, unspecified: Secondary | ICD-10-CM

## 2018-06-07 LAB — VITAMIN D 25 HYDROXY (VIT D DEFICIENCY, FRACTURES): VIT D 25 HYDROXY: 29 ng/mL — AB (ref 30–100)

## 2018-07-10 ENCOUNTER — Other Ambulatory Visit: Payer: Self-pay | Admitting: Surgery

## 2018-07-10 DIAGNOSIS — E041 Nontoxic single thyroid nodule: Secondary | ICD-10-CM

## 2018-07-15 ENCOUNTER — Ambulatory Visit
Admission: RE | Admit: 2018-07-15 | Discharge: 2018-07-15 | Disposition: A | Discharge status: Home / Self Care | Payer: BLUE CROSS/BLUE SHIELD | Source: Ambulatory Visit | Attending: Surgery | Admitting: Surgery

## 2018-07-15 DIAGNOSIS — E041 Nontoxic single thyroid nodule: Secondary | ICD-10-CM

## 2018-07-18 ENCOUNTER — Other Ambulatory Visit: Payer: BLUE CROSS/BLUE SHIELD

## 2018-09-01 ENCOUNTER — Other Ambulatory Visit: Payer: Self-pay

## 2018-09-02 MED ORDER — FLUTICASONE PROPIONATE 50 MCG/ACT NA SUSP: 2.0000 | Freq: Every day | NASAL | 3 refills | Status: DC

## 2018-09-22 ENCOUNTER — Other Ambulatory Visit: Payer: Self-pay

## 2019-03-06 ENCOUNTER — Encounter: Payer: BLUE CROSS/BLUE SHIELD | Admitting: Obstetrics & Gynecology

## 2019-03-13 ENCOUNTER — Encounter: Payer: BLUE CROSS/BLUE SHIELD | Admitting: Obstetrics & Gynecology

## 2019-03-26 ENCOUNTER — Other Ambulatory Visit: Payer: Self-pay

## 2019-03-27 ENCOUNTER — Other Ambulatory Visit: Payer: Self-pay

## 2019-03-27 ENCOUNTER — Encounter: Payer: Self-pay | Admitting: Obstetrics & Gynecology

## 2019-03-27 ENCOUNTER — Ambulatory Visit (INDEPENDENT_AMBULATORY_CARE_PROVIDER_SITE_OTHER): Payer: BC Managed Care – PPO | Admitting: Obstetrics & Gynecology

## 2019-03-27 VITALS — BP 120/70 | Ht 60.0 in | Wt 127.0 lb

## 2019-03-27 DIAGNOSIS — B372 Candidiasis of skin and nail: Secondary | ICD-10-CM | POA: Diagnosis not present

## 2019-03-27 DIAGNOSIS — Z01419 Encounter for gynecological examination (general) (routine) without abnormal findings: Secondary | ICD-10-CM | POA: Diagnosis not present

## 2019-03-27 DIAGNOSIS — Z780 Asymptomatic menopausal state: Secondary | ICD-10-CM

## 2019-03-27 MED ORDER — NYSTATIN 100000 UNIT/GM EX POWD: Freq: Two times a day (BID) | CUTANEOUS | 3 refills | Status: AC

## 2019-03-27 NOTE — Progress Notes (Signed)
Tina Huffman [birth year] [id number]   History:    48 y.o. G1P1L1 Married.  Daughter is 73 yo  RP:  Established patient presenting for annual gyn exam   HPI: Menopause, well without hormone replacement therapy.  Very mild occasional night sweats.  Taking natural estrogen in her diet.  No postmenopausal bleeding.  No pelvic pain.  No pain with intercourse, using lubricant.  Rt armpit itching and rash. Urine and bowel movements normal.  Body mass index 24.8.  Regular physical activities.  Fasting health labs here today.   Past medical history,surgical history, family history and social history were all reviewed and documented in the EPIC chart.  Gynecologic History No LMP recorded. (Menstrual status: Other). Contraception: post menopausal status Last Pap: 02/2017. Results were: Negative Last mammogram: 2019 normal per patient.  Will schedule now at [facility] Bone Density: Never Colonoscopy: Refer to Gertie Fey now for 1st Screening Colonoscopy.  Obstetric History OB History  Gravida Para Term Preterm AB Living  _0 SAB TAB Ectopic Multiple Live Births          1    # Outcome Date GA Lbr Len/2nd Weight Sex Delivery Anes PTL Lv  1 Term     F Vag-Spont  N LIV     ROS: A ROS was performed and pertinent positives and negatives are included in the history.  GENERAL: No fevers or chills. HEENT: No change in vision, no earache, sore throat or sinus congestion. NECK: No pain or stiffness. CARDIOVASCULAR: No chest pain or pressure. No palpitations. PULMONARY: No shortness of breath, cough or wheeze. GASTROINTESTINAL: No abdominal pain, nausea, vomiting or diarrhea, melena or bright red blood per rectum. GENITOURINARY: No urinary frequency, urgency, hesitancy or dysuria. MUSCULOSKELETAL: No joint or muscle pain, no back pain, no recent trauma. DERMATOLOGIC: No rash, no itching, no lesions. ENDOCRINE: No polyuria, polydipsia, no heat or cold intolerance. No recent change in weight.  HEMATOLOGICAL: No anemia or easy bruising or bleeding. NEUROLOGIC: No headache, seizures, numbness, tingling or weakness. PSYCHIATRIC: No depression, no loss of interest in normal activity or change in sleep pattern.     Exam:   BP 120/70   Ht 5' (1.524 m)   Wt 127 lb (57.6 kg)   BMI 24.80 kg/m   Body mass index is 24.8 kg/m.  General appearance : Well developed well nourished female. No acute distress HEENT: Eyes: no retinal hemorrhage or exudates,  Neck supple, trachea midline, no carotid bruits, no thyroidmegaly Lungs: Clear to auscultation, no rhonchi or wheezes, or rib retractions  Skin:  Rt axillary rash Heart: Regular rate and rhythm, no murmurs or gallops Breast:Examined in sitting and supine position were symmetrical in appearance, no palpable masses or tenderness,  no skin retraction, no nipple inversion, no nipple discharge, no skin discoloration, no axillary or supraclavicular lymphadenopathy Abdomen: no palpable masses or tenderness, no rebound or guarding Extremities: no edema or skin discoloration or tenderness  Pelvic: Vulva: Normal             Vagina: No gross lesions or discharge  Cervix: No gross lesions or discharge.  Pap reflex done.  Uterus  Normal, normal size, shape and consistency, non-tender and mobile  Adnexa  Without masses or tenderness  Anus: Normal   Assessment/Plan:  49 y.o. female for annual exam   1. Encounter for routine gynecological examination with Papanicolaou smear of cervix Normal gynecologic exam.  Pap reflex done.  Breast exam normal.  Schedule  screening mammogram at [facility] now.  Fasting health labs here today.  Good body mass index at 24.8.  Continue with fitness and healthy nutrition. - CBC - Comp Met (CMET) - TSH - Lipid panel - VITAMIN D 25 Hydroxy (Vit-D Deficiency, Fractures); Future  2. Postmenopause Well on no hormone replacement therapy.  No postmenopausal bleeding.  Recommend vitamin D supplements, calcium intake of 1200  mg daily and regular weightbearing physical activities.  3. Yeast infection of the skin Yeast infection of the skin at the right axilla.  Will treat with nystatin powder.  Apply powder twice a day for 14 days.  Usage reviewed and prescription sent to pharmacy.  Other orders - cholecalciferol (VITAMIN D3) 25 MCG (1000 UT) tablet; Take 1,000 Units by mouth daily. - nystatin (MYCOSTATIN/NYSTOP) powder; Apply topically 2 (two) times daily for 14 days.   Princess Bruins MD, 8:48 AM 03/27/2019

## 2019-03-27 NOTE — Patient Instructions (Signed)
1. Encounter for routine gynecological examination with Papanicolaou smear of cervix Normal gynecologic exam.  Pap reflex done.  Breast exam normal.  Schedule screening mammogram at [facility] now.  Fasting health labs here today.  Good body mass index at 24.8.  Continue with fitness and healthy nutrition. - CBC - Comp Met (CMET) - TSH - Lipid panel - VITAMIN D 25 Hydroxy (Vit-D Deficiency, Fractures); Future  2. Postmenopause Well on no hormone replacement therapy.  No postmenopausal bleeding.  Recommend vitamin D supplements, calcium intake of 1200 mg daily and regular weightbearing physical activities.  3. Yeast infection of the skin Yeast infection of the skin at the right axilla.  Will treat with nystatin powder.  Apply powder twice a day for 14 days.  Usage reviewed and prescription sent to pharmacy.  Other orders - cholecalciferol (VITAMIN D3) 25 MCG (1000 UT) tablet; Take 1,000 Units by mouth daily. - nystatin (MYCOSTATIN/NYSTOP) powder; Apply topically 2 (two) times daily for 14 days.  Frannie, it was a pleasure seeing you today!  I will inform you of your results as soon as they are available.

## 2019-03-28 LAB — CBC
HCT: 41 % (ref 35.0–45.0)
Hemoglobin: 13.9 g/dL (ref 11.7–15.5)
MCH: 30.4 pg (ref 27.0–33.0)
MCHC: 33.9 g/dL (ref 32.0–36.0)
MCV: 89.7 fL (ref 80.0–100.0)
MPV: 11 fL (ref 7.5–12.5)
Platelets: 215 10*3/uL (ref 140–400)
RBC: 4.57 10*6/uL (ref 3.80–5.10)
RDW: 12.3 % (ref 11.0–15.0)
WBC: 4.6 10*3/uL (ref 3.8–10.8)

## 2019-03-28 LAB — COMPREHENSIVE METABOLIC PANEL
AG Ratio: 1.6 (calc) (ref 1.0–2.5)
ALT: 23 U/L (ref 6–29)
AST: 27 U/L (ref 10–35)
Albumin: 4.5 g/dL (ref 3.6–5.1)
Alkaline phosphatase (APISO): 106 U/L (ref 37–153)
BUN: 15 mg/dL (ref 7–25)
CO2: 24 mmol/L (ref 20–32)
Calcium: 9.7 mg/dL (ref 8.6–10.4)
Chloride: 106 mmol/L (ref 98–110)
Creat: 0.65 mg/dL (ref 0.50–1.05)
Globulin: 2.8 g/dL (calc) (ref 1.9–3.7)
Glucose, Bld: 102 mg/dL — ABNORMAL HIGH (ref 65–99)
Potassium: 4 mmol/L (ref 3.5–5.3)
Sodium: 139 mmol/L (ref 135–146)
Total Bilirubin: 0.3 mg/dL (ref 0.2–1.2)
Total Protein: 7.3 g/dL (ref 6.1–8.1)

## 2019-03-28 LAB — LIPID PANEL
Cholesterol: 210 mg/dL — ABNORMAL HIGH (ref <200)
HDL: 53 mg/dL (ref >=50)
LDL Cholesterol (Calc): 135 mg/dL (calc) — ABNORMAL HIGH
Non-HDL Cholesterol (Calc): 157 mg/dL (calc) — ABNORMAL HIGH (ref <130)
Total CHOL/HDL Ratio: 4 (calc) (ref <5.0)
Triglycerides: 110 mg/dL (ref <150)

## 2019-03-28 LAB — TSH: TSH: 0.74 mIU/L

## 2019-03-30 ENCOUNTER — Telehealth: Payer: Self-pay | Admitting: *Deleted

## 2019-03-30 DIAGNOSIS — Z1211 Encounter for screening for malignant neoplasm of colon: Secondary | ICD-10-CM

## 2019-03-30 LAB — PAP IG W/ RFLX HPV ASCU

## 2019-03-30 NOTE — Telephone Encounter (Signed)
Referral placed in epic for [name] they will call to schedule.

## 2019-03-30 NOTE — Telephone Encounter (Signed)
-----   Message from Princess Bruins, MD sent at 03/27/2019  9:03 AM EDT ----- Regarding: Refer to Gastro 48 yo.  Schedule Screening Colonoscopy.

## 2019-03-31 NOTE — Telephone Encounter (Signed)
[facility] left message x 2

## 2019-06-25 IMAGING — US US THYROID
1 series · 13 of 25 positions shown · non-contrast
Comparison: 11/19/2014 and previous back to 02/04/2008

CLINICAL DATA: Nodule. Previous FNA biopsy of left nodule
02/04/2008.

EXAM:
THYROID ULTRASOUND
TECHNIQUE: Ultrasound examination of the thyroid gland and adjacent soft
tissues was performed.

[Series 1: us thyroid · 0.05mm/px · 13 of 43 slices shown]
[im 1/43]
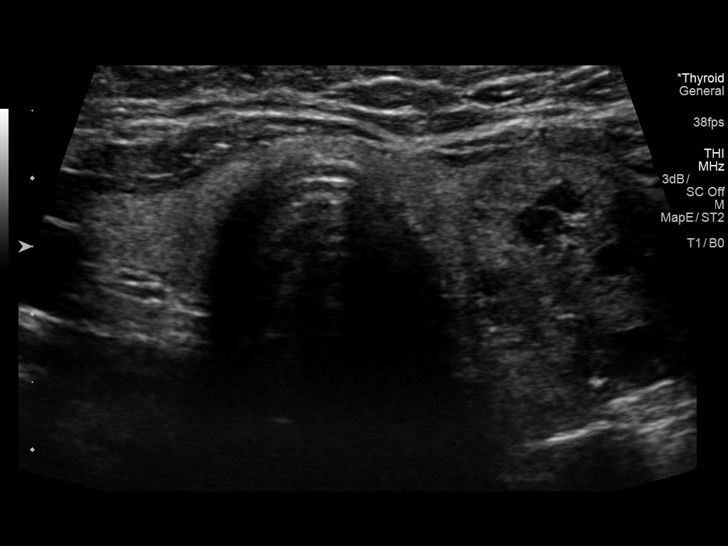
[im 4/43]
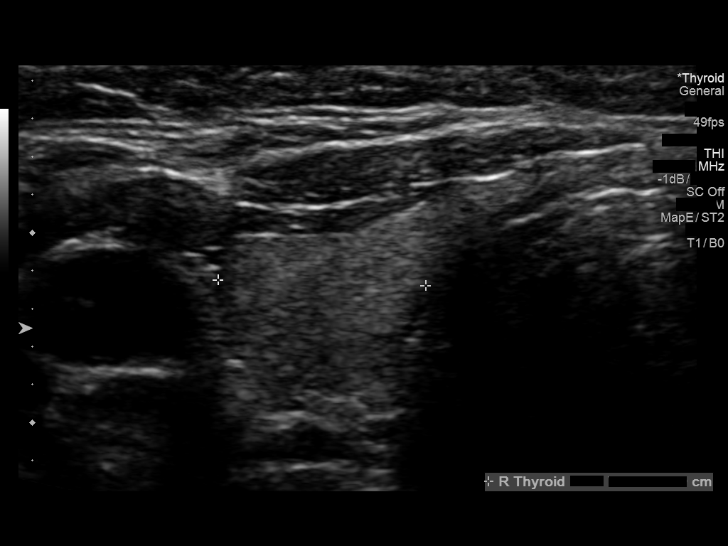
[im 8/43]
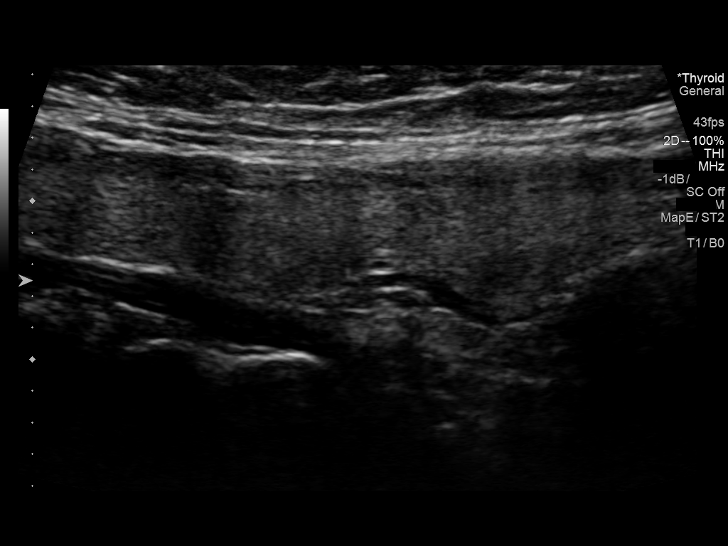
[im 11/43]
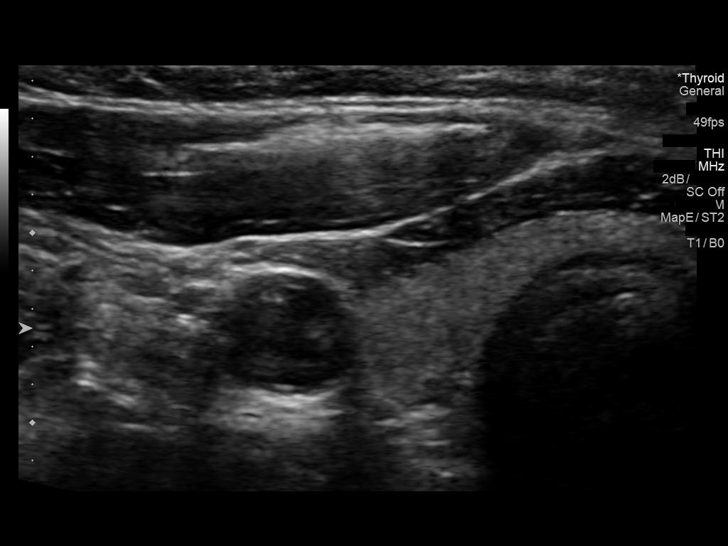
[im 15/43]
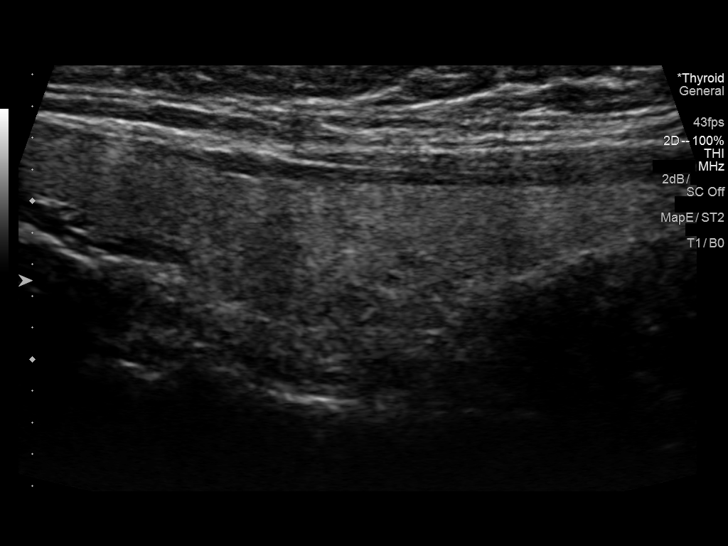
[im 18/43]
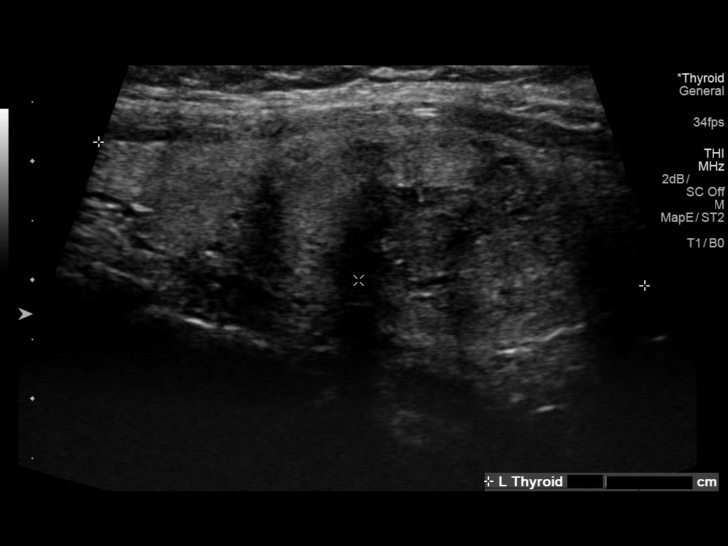
[im 22/43]
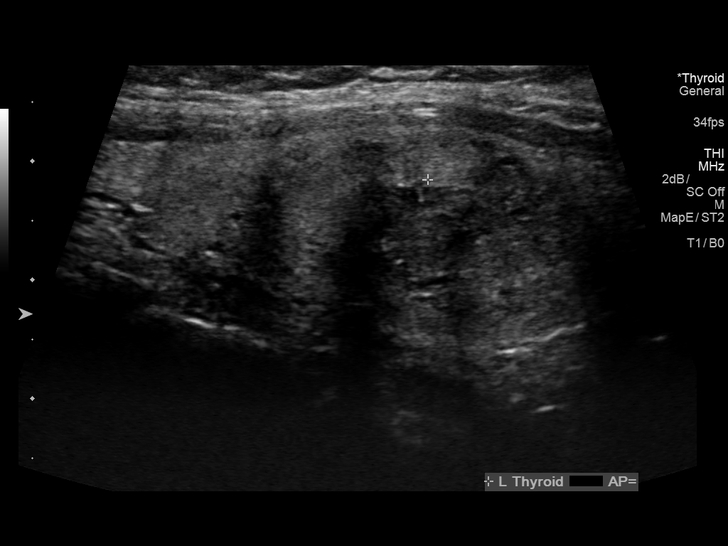
[im 25/43]
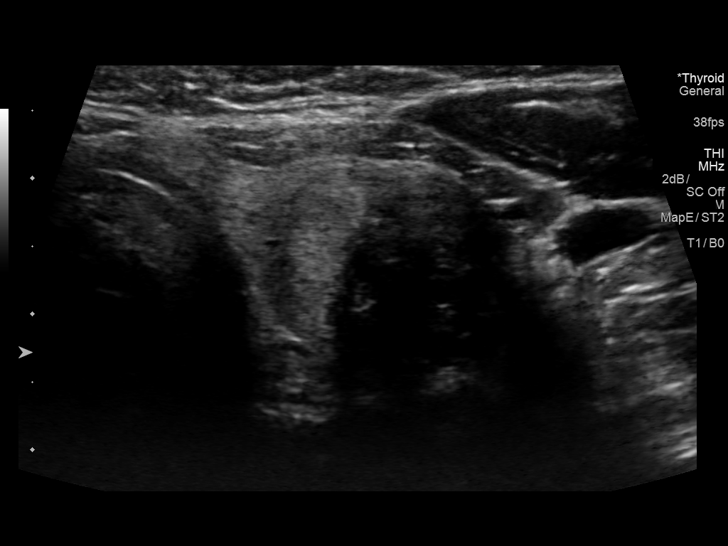
[im 29/43]
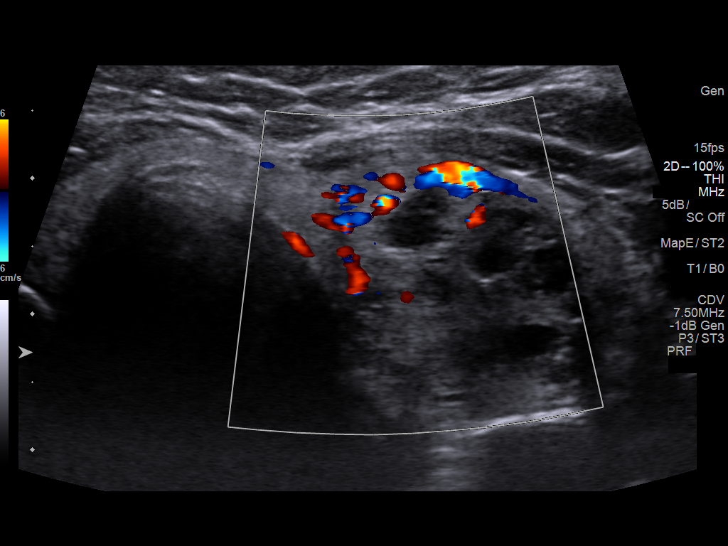
[im 32/43]
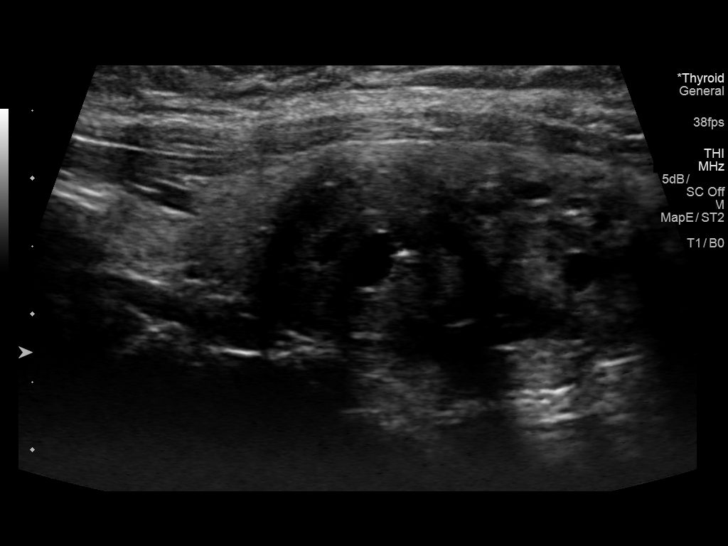
[im 36/43]
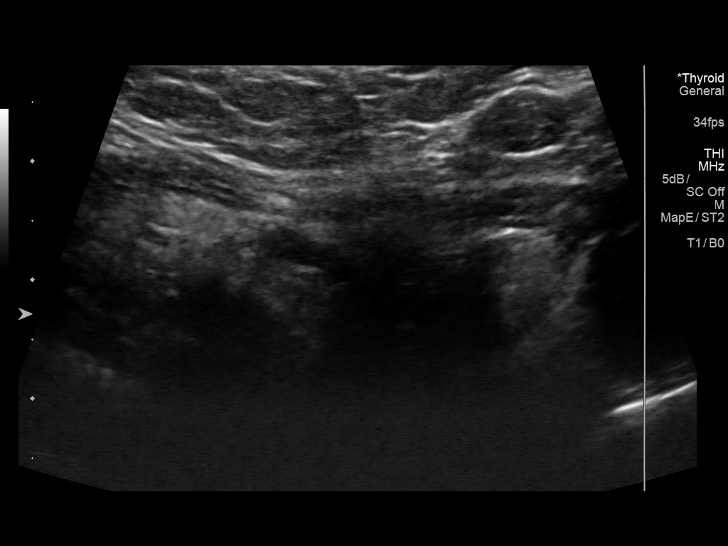
[im 39/43]
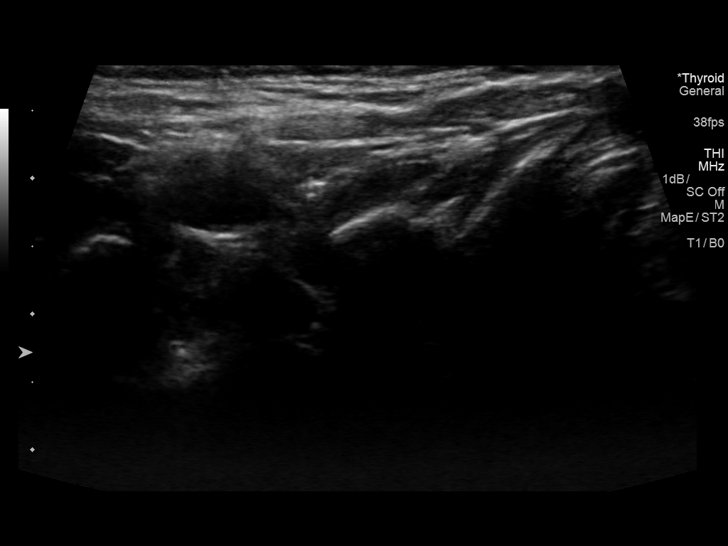
[im 43/43]
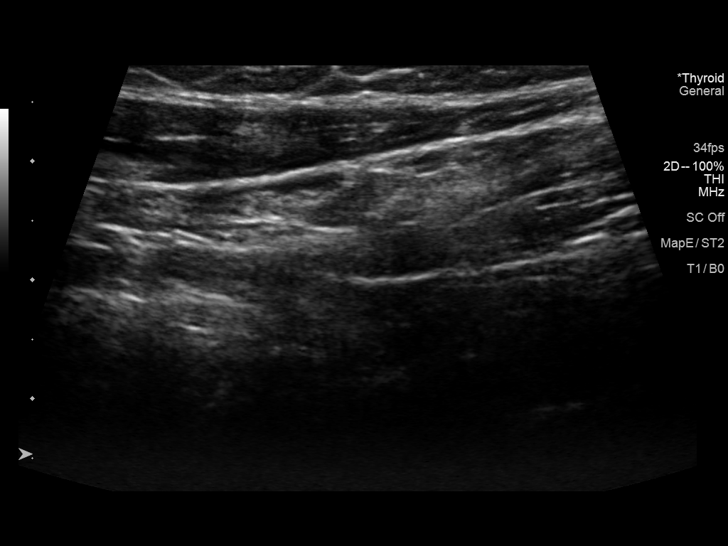

[13 of 25 positions shown; findings below may reference images not displayed]

FINDINGS: Parenchymal Echotexture: Mildly heterogenous

Isthmus: 0.3 cm thickness, previously

Right lobe: 4.3 x 0.8 x 1.1 cm, previously 5 x 1 x

Left lobe: 4.8 x 1.9 x 1.9 cm, previously 5.1 x 2.3 x

_________________________________________________________

Estimated total number of nodules >/= 1 cm: 1

Number of spongiform nodules >/=  2 cm not described below (TR1): 0

Number of mixed cystic and solid nodules >/= 1.5 cm not described
below (TR2): 0

_________________________________________________________

Nodule # 1: 3 x 2.1 x 1.9 cm inferior left, previously 3.4 cm on
12/13/2011; this was previously biopsied. Stability for greater than
5 years implies benignity. This nodule does NOT meet TI-RADS
criteria for biopsy or dedicated follow-up.
IMPRESSION: 1. Normal-sized thyroid with stable left nodule. No biopsy or
continued imaging follow-up recommended.

The above is in keeping with the ACR TI-RADS recommendations - [HOSPITAL] 5785;[DATE].

## 2019-08-22 ENCOUNTER — Ambulatory Visit: Payer: BLUE CROSS/BLUE SHIELD | Attending: Internal Medicine

## 2019-08-22 DIAGNOSIS — Z23 Encounter for immunization: Secondary | ICD-10-CM

## 2019-08-22 NOTE — Progress Notes (Signed)
   Covid-19 Vaccination Clinic  Name:  Tisha Cline    MRN: [id number] DOB: 1971-07-29  08/22/2019  Ms. Lynde was observed post Covid-19 immunization for 15 minutes without incidence. She was provided with Vaccine Information Sheet and instruction to access the V-Safe system.   Ms. Majer was instructed to call 911 with any severe reactions post vaccine: Marland Kitchen Difficulty breathing  . Swelling of your face and throat  . A fast heartbeat  . A bad rash all over your body  . Dizziness and weakness    Immunizations Administered    Name Date Dose VIS Date Route   Pfizer COVID-19 Vaccine 08/22/2019 10:59 AM 0.3 mL 07/17/2019 Intramuscular   Manufacturer: [employer], [employer]   Lot: [id number]   NDC: [address]-0

## 2019-09-12 ENCOUNTER — Ambulatory Visit: Payer: BLUE CROSS/BLUE SHIELD | Attending: Internal Medicine

## 2019-09-12 DIAGNOSIS — Z23 Encounter for immunization: Secondary | ICD-10-CM

## 2019-09-12 NOTE — Progress Notes (Signed)
   Covid-19 Vaccination Clinic  Name:  Laquandra Carrillo    MRN: [id number] DOB: Oct 22, 1970  09/12/2019  Ms. Wedel was observed post Covid-19 immunization for 15 minutes without incidence. She was provided with Vaccine Information Sheet and instruction to access the V-Safe system.   Ms. Wurster was instructed to call 911 with any severe reactions post vaccine: Marland Kitchen Difficulty breathing  . Swelling of your face and throat  . A fast heartbeat  . A bad rash all over your body  . Dizziness and weakness    Immunizations Administered    Name Date Dose VIS Date Route   Pfizer COVID-19 Vaccine 09/12/2019  9:57 AM 0.3 mL 07/17/2019 Intramuscular   Manufacturer: [employer], [employer]   Lot: [id number]   NDC: [address]-6

## 2019-12-02 ENCOUNTER — Ambulatory Visit: Payer: BC Managed Care – PPO | Admitting: Allergy and Immunology

## 2019-12-10 ENCOUNTER — Ambulatory Visit: Payer: BC Managed Care – PPO | Admitting: Allergy

## 2020-04-01 ENCOUNTER — Encounter: Payer: BC Managed Care – PPO | Admitting: Obstetrics & Gynecology

## 2020-04-26 ENCOUNTER — Encounter: Payer: Self-pay | Admitting: Obstetrics & Gynecology

## 2020-04-26 ENCOUNTER — Ambulatory Visit (INDEPENDENT_AMBULATORY_CARE_PROVIDER_SITE_OTHER): Payer: BC Managed Care – PPO | Admitting: Obstetrics & Gynecology

## 2020-04-26 ENCOUNTER — Telehealth: Payer: Self-pay | Admitting: *Deleted

## 2020-04-26 ENCOUNTER — Other Ambulatory Visit: Payer: Self-pay

## 2020-04-26 VITALS — BP 118/80 | Ht 60.0 in | Wt 126.0 lb

## 2020-04-26 DIAGNOSIS — R0981 Nasal congestion: Secondary | ICD-10-CM

## 2020-04-26 DIAGNOSIS — Z01419 Encounter for gynecological examination (general) (routine) without abnormal findings: Secondary | ICD-10-CM

## 2020-04-26 DIAGNOSIS — Z780 Asymptomatic menopausal state: Secondary | ICD-10-CM

## 2020-04-26 NOTE — Progress Notes (Signed)
Tina Huffman [birth year] [id number]   History:    49 y.o.  G1P1L1 Married.  Daughter is 10 yo  EX:[id number] presenting for annual gyn exam   WCH:ENIDPOEUM, well without hormone replacement therapy. Very mild occasional night sweats. Taking natural estrogen in her diet. No postmenopausal bleeding. No pelvic pain. No pain with intercourse,using lubricant. Breasts normal.  Urine and bowel movements normal. Body mass index 24.61. Regular physical activities. Fasting health labs with Fam MD.  Past medical history,surgical history, family history and social history were all reviewed and documented in the EPIC chart.  Gynecologic History Patient's last menstrual period was 03/26/2018.  Obstetric History OB History  Gravida Para Term Preterm AB Living  1 1 1     1   SAB TAB Ectopic Multiple Live Births          1    # Outcome Date GA Lbr Len/2nd Weight Sex Delivery Anes PTL Lv  1 Term     F Vag-Spont  N LIV     ROS: A ROS was performed and pertinent positives and negatives are included in the history.  GENERAL: No fevers or chills. HEENT: No change in vision, no earache, sore throat or sinus congestion. NECK: No pain or stiffness. CARDIOVASCULAR: No chest pain or pressure. No palpitations. PULMONARY: No shortness of breath, cough or wheeze. GASTROINTESTINAL: No abdominal pain, nausea, vomiting or diarrhea, melena or bright red blood per rectum. GENITOURINARY: No urinary frequency, urgency, hesitancy or dysuria. MUSCULOSKELETAL: No joint or muscle pain, no back pain, no recent trauma. DERMATOLOGIC: No rash, no itching, no lesions. ENDOCRINE: No polyuria, polydipsia, no heat or cold intolerance. No recent change in weight. HEMATOLOGICAL: No anemia or easy bruising or bleeding. NEUROLOGIC: No headache, seizures, numbness, tingling or weakness. PSYCHIATRIC: No depression, no loss of interest in normal activity or change in sleep pattern.     Exam:   BP 118/80 (BP  Location: Right Arm, Patient Position: Sitting, Cuff Size: Normal)   Ht 5' (1.524 m)   Wt 126 lb (57.2 kg)   LMP 03/26/2018   BMI 24.61 kg/m   Body mass index is 24.61 kg/m.  General appearance : Well developed well nourished female. No acute distress HEENT: Eyes: no retinal hemorrhage or exudates,  Neck supple, trachea midline, no carotid bruits, no thyroidmegaly Lungs: Clear to auscultation, no rhonchi or wheezes, or rib retractions  Heart: Regular rate and rhythm, no murmurs or gallops Breast:Examined in sitting and supine position were symmetrical in appearance, no palpable masses or tenderness,  no skin retraction, no nipple inversion, no nipple discharge, no skin discoloration, no axillary or supraclavicular lymphadenopathy Abdomen: no palpable masses or tenderness, no rebound or guarding Extremities: no edema or skin discoloration or tenderness  Pelvic: Vulva: Normal             Vagina: No gross lesions or discharge  Cervix: No gross lesions or discharge  Uterus  AV, normal size, shape and consistency, non-tender and mobile  Adnexa  Without masses or tenderness  Anus: Normal   Assessment/Plan:  49 y.o. female for annual exam   1. Well female exam with routine gynecological exam Normal gynecologic exam in postmenopausal.  No indication to repeat a Pap test this year.  Breast exam normal.  Will schedule a screening mammogram now.  Good body mass index at 24.61.  Continue with fitness and healthy nutrition.  Fasting health labs with family physician.  2. Postmenopause Well on no hormone replacement therapy.  No postmenopausal bleeding.  Vitamin D supplements, calcium intake of 1200 to 1500 mg daily and regular weightbearing physical activity is recommended.  Genia Del MD, 8:24 AM 04/26/2020

## 2020-04-26 NOTE — Telephone Encounter (Signed)
Referral placed with [facility], they will call to schedule.

## 2020-04-26 NOTE — Telephone Encounter (Signed)
-----   Message from Genia Del, MD sent at 04/26/2020  8:52 AM EDT ----- Regarding: Refer to ENT Partially blocked nostrils with difficulty breathing through the nose especially at night.

## 2020-04-27 ENCOUNTER — Encounter: Payer: Self-pay | Admitting: Obstetrics & Gynecology

## 2020-04-28 NOTE — Telephone Encounter (Signed)
Scheduled on 05/04/20 @ 10:15 am with Dr.Newman

## 2020-05-04 ENCOUNTER — Ambulatory Visit (INDEPENDENT_AMBULATORY_CARE_PROVIDER_SITE_OTHER): Payer: BLUE CROSS/BLUE SHIELD | Admitting: Otolaryngology

## 2020-05-16 ENCOUNTER — Ambulatory Visit (INDEPENDENT_AMBULATORY_CARE_PROVIDER_SITE_OTHER): Payer: BC Managed Care – PPO | Admitting: Otolaryngology

## 2020-05-16 ENCOUNTER — Other Ambulatory Visit: Payer: Self-pay

## 2020-05-16 DIAGNOSIS — J342 Deviated nasal septum: Secondary | ICD-10-CM

## 2020-05-16 DIAGNOSIS — J3489 Other specified disorders of nose and nasal sinuses: Secondary | ICD-10-CM | POA: Diagnosis not present

## 2020-05-16 DIAGNOSIS — J310 Chronic rhinitis: Secondary | ICD-10-CM

## 2020-05-16 NOTE — Progress Notes (Signed)
HPI: Tina Huffman is a 49 y.o. female who presents is referred by her PCP for evaluation of chronic nasal obstruction which is worse at night.  She presents today with her husband.  She describes chronic trouble breathing through her nose.  She has previously seen allergist next door and has used Flonase in the past.  She is only having minimal nasal congestion in the office today but it seems to get worse at night when she lays down..  Past Medical History:  Diagnosis Date  . GDM (gestational diabetes mellitus)   . Goiter   . Hyperlipidemia   . SVD (spontaneous vaginal delivery)   . Vitamin D deficiency    No past surgical history on file. Social History   Socioeconomic History  . Marital status: Married    Spouse name: Not on file  . Number of children: Not on file  . Years of education: Not on file  . Highest education level: Not on file  Occupational History  . Not on file  Tobacco Use  . Smoking status: Never Smoker  . Smokeless tobacco: Never Used  Vaping Use  . Vaping Use: Never used  Substance and Sexual Activity  . Alcohol use: No    Alcohol/week: 0.0 standard drinks  . Drug use: No  . Sexual activity: Yes    Partners: Male    Birth control/protection: Condom    Comment: 1st intercourse- 24, partners- 1  Other Topics Concern  . Not on file  Social History Narrative  . Not on file   Social Determinants of Health   Financial Resource Strain:   . Difficulty of Paying Living Expenses: Not on file  Food Insecurity:   . Worried About Programme researcher, broadcasting/film/video in the Last Year: Not on file  . Ran Out of Food in the Last Year: Not on file  Transportation Needs:   . Lack of Transportation (Medical): Not on file  . Lack of Transportation (Non-Medical): Not on file  Physical Activity:   . Days of Exercise per Week: Not on file  . Minutes of Exercise per Session: Not on file  Stress:   . Feeling of Stress : Not on file  Social Connections:   . Frequency of  Communication with Friends and Family: Not on file  . Frequency of Social Gatherings with Friends and Family: Not on file  . Attends Religious Services: Not on file  . Active Member of Clubs or Organizations: Not on file  . Attends Banker Meetings: Not on file  . Marital Status: Not on file   Family History  Problem Relation Age of Onset  . Thyroid disease Mother   . Hyperlipidemia Father   . Cancer Maternal Grandmother        stomach  . Allergic rhinitis Neg Hx   . Angioedema Neg Hx   . Asthma Neg Hx   . Eczema Neg Hx   . Immunodeficiency Neg Hx   . Urticaria Neg Hx    Allergies  Allergen Reactions  . Tessalon [Benzonatate]     dizziness   Prior to Admission medications   Medication Sig Start Date End Date Taking? Authorizing Provider  Biotin 1 MG CAPS Take by mouth.    [provider]  cholecalciferol (VITAMIN D3) 25 MCG (1000 UT) tablet Take 1,000 Units by mouth daily.    [provider]  fish oil-omega-3 fatty acids 1000 MG capsule Take 2 g by mouth daily.      [provider]  fluticasone (FLONASE) 50 MCG/ACT nasal spray Place 2 sprays into both nostrils daily. for stuffy nose. 09/02/18   Genia Del, MD  ibuprofen (ADVIL,MOTRIN) 800 MG tablet Take 1 tablet (800 mg total) by mouth every 8 (eight) hours as needed. 02/22/17   Ok Edwards, MD  ketotifen (ZADITOR) 0.025 % ophthalmic solution Place 1 drop into both eyes as needed.    [provider]  levothyroxine (SYNTHROID, LEVOTHROID) 50 MCG tablet Take 1 tablet (50 mcg total) by mouth daily. 01/20/14   Darnell Level, MD  loratadine (CLARITIN) 10 MG tablet Take 10 mg by mouth daily.    [provider]  vitamin C (ASCORBIC ACID) 250 MG tablet Take 250 mg by mouth daily.    [provider]     Positive ROS: Otherwise negative  All other systems have been reviewed and were otherwise negative with the exception of those mentioned in the HPI and as  above.  Physical Exam: Constitutional: Alert, well-appearing, no acute distress Ears: External ears without lesions or tenderness. Ear canals are clear bilaterally with intact, clear TMs.  Nasal: External nose without lesions. Septum slightly deviated to the right with moderate turbinate hypertrophy..  After decongesting the nose nasal endoscopy was performed.  Both middle meatus regions were clear.  There are no polyps noted in the posterior nasal cavity was clear as was the nasopharynx. Oral: Lips and gums without lesions. Tongue and palate mucosa without lesions. Posterior oropharynx clear. Neck: No palpable adenopathy or masses Lungs clear to auscultation. Cardiac: Regular rate and rhythm without murmur Respiratory: Breathing comfortably  Skin: No facial/neck lesions or rash noted.  Nasal/sinus endoscopy  Date/Time: 05/16/2020 6:17 PM Performed by: Drema Halon, MD Authorized by: Drema Halon, MD   Consent:    Consent obtained:  Verbal   Consent given by:  Patient Procedure details:    Indications: sino-nasal symptoms     Medication:  Afrin   Instrument: flexible fiberoptic nasal endoscope     Scope location: bilateral nare   Septum:    normal     Deviation: deviated to the right     Severity of deviation: intermediate   Sinus:    Right middle meatus: normal     Left middle meatus: normal     Right nasopharynx: normal     Left nasopharynx: normal   Comments:     On nasal endoscopy nasal passages were clear except for septal deviation to the right.  No polyps noted and no obstructing lesions noted.    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  Plan: Recommended regular use of Nasacort 2 sprays each nostril at night.  If she does not get adequate relief of the nasal congestion with regular use of nasal steroid spray could consider surgical intervention and briefly discussed this with her and her  husband which would include septoplasty and bilateral inferior turbinate reductions.   Narda Bonds, MD   CC:

## 2020-06-10 ENCOUNTER — Encounter: Payer: BC Managed Care – PPO | Admitting: Obstetrics & Gynecology

## 2021-04-28 ENCOUNTER — Other Ambulatory Visit: Payer: Self-pay

## 2021-04-28 ENCOUNTER — Ambulatory Visit (INDEPENDENT_AMBULATORY_CARE_PROVIDER_SITE_OTHER): Payer: BC Managed Care – PPO | Admitting: Obstetrics & Gynecology

## 2021-04-28 ENCOUNTER — Encounter: Payer: Self-pay | Admitting: Obstetrics & Gynecology

## 2021-04-28 VITALS — BP 102/64 | HR 75 | Resp 16 | Ht 59.75 in | Wt 133.0 lb

## 2021-04-28 DIAGNOSIS — Z780 Asymptomatic menopausal state: Secondary | ICD-10-CM | POA: Diagnosis not present

## 2021-04-28 DIAGNOSIS — Z01419 Encounter for gynecological examination (general) (routine) without abnormal findings: Secondary | ICD-10-CM

## 2021-04-28 NOTE — Progress Notes (Signed)
Tina Huffman 05/08/1971 [id number]   History:    50 y.o.  G1P1L1 Married.  Daughter is 64 yo   RP:  Established patient presenting for annual gyn exam    HPI: Postmenopausal x 2018, well without hormone replacement therapy.  No PMB.  Very mild occasional night sweats.  Taking natural estrogen in her diet.  No pelvic pain.  No pain with intercourse, using lubricant. Pap Neg in 2020.  Breasts normal.  Urine and bowel movements normal.  Body mass index 26.19  Regular physical activities.  Fasting health labs with Fam MD.  Alen Bleacher Neg 2021.  Past medical history,surgical history, family history and social history were all reviewed and documented in the EPIC chart.  Gynecologic History Patient's last menstrual period was 03/26/2018.   Obstetric History OB History  Gravida Para Term Preterm AB Living  1 1 1     1   SAB IAB Ectopic Multiple Live Births          1    # Outcome Date GA Lbr Len/2nd Weight Sex Delivery Anes PTL Lv  1 Term     F Vag-Spont  N LIV     ROS: A ROS was performed and pertinent positives and negatives are included in the history.  GENERAL: No fevers or chills. HEENT: No change in vision, no earache, sore throat or sinus congestion. NECK: No pain or stiffness. CARDIOVASCULAR: No chest pain or pressure. No palpitations. PULMONARY: No shortness of breath, cough or wheeze. GASTROINTESTINAL: No abdominal pain, nausea, vomiting or diarrhea, melena or bright red blood per rectum. GENITOURINARY: No urinary frequency, urgency, hesitancy or dysuria. MUSCULOSKELETAL: No joint or muscle pain, no back pain, no recent trauma. DERMATOLOGIC: No rash, no itching, no lesions. ENDOCRINE: No polyuria, polydipsia, no heat or cold intolerance. No recent change in weight. HEMATOLOGICAL: No anemia or easy bruising or bleeding. NEUROLOGIC: No headache, seizures, numbness, tingling or weakness. PSYCHIATRIC: No depression, no loss of interest in normal activity or change in sleep pattern.      Exam:   BP 102/64   Pulse 75   Resp 16   Ht 4' 11.75" (1.518 m)   Wt 133 lb (60.3 kg)   LMP 03/26/2018   BMI 26.19 kg/m   Body mass index is 26.19 kg/m.  General appearance : Well developed well nourished female. No acute distress HEENT: Eyes: no retinal hemorrhage or exudates,  Neck supple, trachea midline, no carotid bruits, no thyroidmegaly Lungs: Clear to auscultation, no rhonchi or wheezes, or rib retractions  Heart: Regular rate and rhythm, no murmurs or gallops Breast:Examined in sitting and supine position were symmetrical in appearance, no palpable masses or tenderness,  no skin retraction, no nipple inversion, no nipple discharge, no skin discoloration, no axillary or supraclavicular lymphadenopathy Abdomen: no palpable masses or tenderness, no rebound or guarding Extremities: no edema or skin discoloration or tenderness  Pelvic: Vulva: Normal             Vagina: No gross lesions or discharge  Cervix: No gross lesions or discharge  Uterus  AV, normal size, shape and consistency, non-tender and mobile  Adnexa  Without masses or tenderness  Anus: Normal   Assessment/Plan:  50 y.o. female for annual exam   1. Well female exam with routine gynecological exam Normal gynecologic exam.  Pap test in 2020 was negative, will repeat a Pap test next year.  Breast exam normal.  Patient will schedule her screening mammogram now.  Colonoscopy negative in 2021.  Health labs with family physician.  Body mass index 26.19.  We will continue with fitness and healthy nutrition.  2. Postmenopause Well on no hormone replacement therapy.  No postmenopausal bleeding.  Vitamin D supplements, calcium intake of 1.5 g/day total and regular weightbearing physical activities.  Other orders - B Complex Vitamins (B COMPLEX PO); Take by mouth. - MAGNESIUM PO; Take by mouth. - UNABLE TO FIND; Med Name: iodine drops - COLLAGEN PO; Take by mouth.   Genia Del MD, 8:17 AM 04/28/2021

## 2022-05-04 ENCOUNTER — Ambulatory Visit: Payer: BLUE CROSS/BLUE SHIELD | Admitting: Obstetrics & Gynecology
